# Patient Record
Sex: Male | Born: 1945 | Race: White | Hispanic: No | Marital: Married | Smoking: Never smoker
Health system: Southern US, Community
[De-identification: ages and names within clinical notes are randomized; demographics above are authoritative.]

## PROBLEM LIST (undated history)

## (undated) DIAGNOSIS — M545 Low back pain, unspecified: Secondary | ICD-10-CM

## (undated) DIAGNOSIS — E039 Hypothyroidism, unspecified: Secondary | ICD-10-CM

## (undated) DIAGNOSIS — N400 Enlarged prostate without lower urinary tract symptoms: Secondary | ICD-10-CM

## (undated) DIAGNOSIS — Z87438 Personal history of other diseases of male genital organs: Secondary | ICD-10-CM

## (undated) DIAGNOSIS — M5030 Other cervical disc degeneration, unspecified cervical region: Secondary | ICD-10-CM

## (undated) DIAGNOSIS — M659 Synovitis and tenosynovitis, unspecified: Secondary | ICD-10-CM

## (undated) DIAGNOSIS — E790 Hyperuricemia without signs of inflammatory arthritis and tophaceous disease: Secondary | ICD-10-CM

## (undated) DIAGNOSIS — H9190 Unspecified hearing loss, unspecified ear: Secondary | ICD-10-CM

## (undated) DIAGNOSIS — Z87898 Personal history of other specified conditions: Secondary | ICD-10-CM

## (undated) DIAGNOSIS — N183 Chronic kidney disease, stage 3 unspecified: Secondary | ICD-10-CM

## (undated) DIAGNOSIS — R768 Other specified abnormal immunological findings in serum: Secondary | ICD-10-CM

## (undated) DIAGNOSIS — Z87442 Personal history of urinary calculi: Secondary | ICD-10-CM

## (undated) DIAGNOSIS — E038 Other specified hypothyroidism: Secondary | ICD-10-CM

## (undated) DIAGNOSIS — R7303 Prediabetes: Secondary | ICD-10-CM

## (undated) DIAGNOSIS — M65939 Unspecified synovitis and tenosynovitis, unspecified forearm: Secondary | ICD-10-CM

## (undated) DIAGNOSIS — M109 Gout, unspecified: Secondary | ICD-10-CM

## (undated) DIAGNOSIS — N189 Chronic kidney disease, unspecified: Secondary | ICD-10-CM

## (undated) DIAGNOSIS — I10 Essential (primary) hypertension: Secondary | ICD-10-CM

## (undated) DIAGNOSIS — I129 Hypertensive chronic kidney disease with stage 1 through stage 4 chronic kidney disease, or unspecified chronic kidney disease: Secondary | ICD-10-CM

## (undated) DIAGNOSIS — E7800 Pure hypercholesterolemia, unspecified: Secondary | ICD-10-CM

## (undated) HISTORY — DX: Pure hypercholesterolemia, unspecified: E78.00

## (undated) HISTORY — DX: Personal history of other specified conditions: Z87.898

## (undated) HISTORY — DX: Other specified hypothyroidism: E03.8

## (undated) HISTORY — DX: Unspecified hearing loss, unspecified ear: H91.90

## (undated) HISTORY — DX: Gout, unspecified: M10.9

## (undated) HISTORY — DX: Hypothyroidism, unspecified: E03.9

## (undated) HISTORY — DX: Chronic kidney disease, stage 3 (moderate): N18.3

## (undated) HISTORY — DX: Unspecified synovitis and tenosynovitis, unspecified forearm: M65.939

## (undated) HISTORY — DX: Personal history of other diseases of male genital organs: Z87.438

## (undated) HISTORY — DX: Low back pain, unspecified: M54.50

## (undated) HISTORY — DX: Hyperuricemia without signs of inflammatory arthritis and tophaceous disease: E79.0

## (undated) HISTORY — PX: APPENDECTOMY: SHX54

## (undated) HISTORY — DX: Benign prostatic hyperplasia without lower urinary tract symptoms: N40.0

## (undated) HISTORY — PX: EYE SURGERY: SHX253

## (undated) HISTORY — DX: Other cervical disc degeneration, unspecified cervical region: M50.30

## (undated) HISTORY — PX: HEMORRHOIDECTOMY WITH HEMORRHOID BANDING: SHX5633

## (undated) HISTORY — PX: BACK SURGERY: SHX140

## (undated) HISTORY — DX: Essential (primary) hypertension: I10

## (undated) HISTORY — DX: Other specified abnormal immunological findings in serum: R76.8

## (undated) HISTORY — DX: Hypertensive chronic kidney disease with stage 1 through stage 4 chronic kidney disease, or unspecified chronic kidney disease: I12.9

## (undated) HISTORY — DX: Low back pain: M54.5

## (undated) HISTORY — DX: Chronic kidney disease, stage 3 unspecified: N18.30

## (undated) HISTORY — DX: Synovitis and tenosynovitis, unspecified: M65.9

## (undated) HISTORY — PX: OTHER SURGICAL HISTORY: SHX169

---

## 1964-06-13 DIAGNOSIS — N183 Chronic kidney disease, stage 3 unspecified: Secondary | ICD-10-CM

## 1964-06-13 HISTORY — DX: Chronic kidney disease, stage 3 unspecified: N18.30

## 2002-05-14 ENCOUNTER — Encounter: Payer: Self-pay | Admitting: *Deleted

## 2002-05-14 ENCOUNTER — Ambulatory Visit (HOSPITAL_COMMUNITY): Admission: RE | Admit: 2002-05-14 | Discharge: 2002-05-14 | Payer: Self-pay | Admitting: *Deleted

## 2011-09-13 DIAGNOSIS — I10 Essential (primary) hypertension: Secondary | ICD-10-CM | POA: Diagnosis not present

## 2011-09-13 DIAGNOSIS — N400 Enlarged prostate without lower urinary tract symptoms: Secondary | ICD-10-CM | POA: Diagnosis not present

## 2011-09-13 DIAGNOSIS — IMO0002 Reserved for concepts with insufficient information to code with codable children: Secondary | ICD-10-CM | POA: Diagnosis not present

## 2011-10-17 DIAGNOSIS — H5210 Myopia, unspecified eye: Secondary | ICD-10-CM | POA: Diagnosis not present

## 2011-10-17 DIAGNOSIS — H524 Presbyopia: Secondary | ICD-10-CM | POA: Diagnosis not present

## 2011-10-17 DIAGNOSIS — H2510 Age-related nuclear cataract, unspecified eye: Secondary | ICD-10-CM | POA: Diagnosis not present

## 2011-10-19 DIAGNOSIS — M3500 Sicca syndrome, unspecified: Secondary | ICD-10-CM | POA: Diagnosis not present

## 2011-10-19 DIAGNOSIS — M2550 Pain in unspecified joint: Secondary | ICD-10-CM | POA: Diagnosis not present

## 2011-10-19 DIAGNOSIS — Z5181 Encounter for therapeutic drug level monitoring: Secondary | ICD-10-CM | POA: Diagnosis not present

## 2011-10-19 DIAGNOSIS — M109 Gout, unspecified: Secondary | ICD-10-CM | POA: Diagnosis not present

## 2011-11-02 DIAGNOSIS — R748 Abnormal levels of other serum enzymes: Secondary | ICD-10-CM | POA: Diagnosis not present

## 2012-02-21 DIAGNOSIS — H2510 Age-related nuclear cataract, unspecified eye: Secondary | ICD-10-CM | POA: Diagnosis not present

## 2012-02-27 DIAGNOSIS — H2510 Age-related nuclear cataract, unspecified eye: Secondary | ICD-10-CM | POA: Diagnosis not present

## 2012-02-27 DIAGNOSIS — H269 Unspecified cataract: Secondary | ICD-10-CM | POA: Diagnosis not present

## 2012-02-28 DIAGNOSIS — H2510 Age-related nuclear cataract, unspecified eye: Secondary | ICD-10-CM | POA: Diagnosis not present

## 2012-03-05 DIAGNOSIS — H2510 Age-related nuclear cataract, unspecified eye: Secondary | ICD-10-CM | POA: Diagnosis not present

## 2012-03-19 DIAGNOSIS — H2510 Age-related nuclear cataract, unspecified eye: Secondary | ICD-10-CM | POA: Diagnosis not present

## 2012-03-19 DIAGNOSIS — H269 Unspecified cataract: Secondary | ICD-10-CM | POA: Diagnosis not present

## 2012-03-20 DIAGNOSIS — H2510 Age-related nuclear cataract, unspecified eye: Secondary | ICD-10-CM | POA: Diagnosis not present

## 2012-03-22 DIAGNOSIS — Z23 Encounter for immunization: Secondary | ICD-10-CM | POA: Diagnosis not present

## 2012-03-27 DIAGNOSIS — H2510 Age-related nuclear cataract, unspecified eye: Secondary | ICD-10-CM | POA: Diagnosis not present

## 2012-05-01 DIAGNOSIS — N400 Enlarged prostate without lower urinary tract symptoms: Secondary | ICD-10-CM | POA: Diagnosis not present

## 2012-05-01 DIAGNOSIS — I10 Essential (primary) hypertension: Secondary | ICD-10-CM | POA: Diagnosis not present

## 2012-05-04 DIAGNOSIS — M2550 Pain in unspecified joint: Secondary | ICD-10-CM | POA: Diagnosis not present

## 2012-05-04 DIAGNOSIS — M109 Gout, unspecified: Secondary | ICD-10-CM | POA: Diagnosis not present

## 2012-05-04 DIAGNOSIS — Z79899 Other long term (current) drug therapy: Secondary | ICD-10-CM | POA: Diagnosis not present

## 2012-05-04 DIAGNOSIS — M3500 Sicca syndrome, unspecified: Secondary | ICD-10-CM | POA: Diagnosis not present

## 2012-10-08 DIAGNOSIS — R946 Abnormal results of thyroid function studies: Secondary | ICD-10-CM | POA: Diagnosis not present

## 2012-10-08 DIAGNOSIS — R209 Unspecified disturbances of skin sensation: Secondary | ICD-10-CM | POA: Diagnosis not present

## 2012-10-26 DIAGNOSIS — Z79899 Other long term (current) drug therapy: Secondary | ICD-10-CM | POA: Diagnosis not present

## 2012-10-26 DIAGNOSIS — M3500 Sicca syndrome, unspecified: Secondary | ICD-10-CM | POA: Diagnosis not present

## 2012-10-26 DIAGNOSIS — G609 Hereditary and idiopathic neuropathy, unspecified: Secondary | ICD-10-CM | POA: Diagnosis not present

## 2012-10-26 DIAGNOSIS — M2550 Pain in unspecified joint: Secondary | ICD-10-CM | POA: Diagnosis not present

## 2012-10-26 DIAGNOSIS — M109 Gout, unspecified: Secondary | ICD-10-CM | POA: Diagnosis not present

## 2012-10-29 DIAGNOSIS — I10 Essential (primary) hypertension: Secondary | ICD-10-CM | POA: Diagnosis not present

## 2012-10-29 DIAGNOSIS — H9190 Unspecified hearing loss, unspecified ear: Secondary | ICD-10-CM | POA: Diagnosis not present

## 2012-10-29 DIAGNOSIS — M109 Gout, unspecified: Secondary | ICD-10-CM | POA: Diagnosis not present

## 2012-10-29 DIAGNOSIS — N400 Enlarged prostate without lower urinary tract symptoms: Secondary | ICD-10-CM | POA: Diagnosis not present

## 2012-11-21 DIAGNOSIS — H905 Unspecified sensorineural hearing loss: Secondary | ICD-10-CM | POA: Diagnosis not present

## 2013-03-06 DIAGNOSIS — Z23 Encounter for immunization: Secondary | ICD-10-CM | POA: Diagnosis not present

## 2013-04-23 DIAGNOSIS — Z125 Encounter for screening for malignant neoplasm of prostate: Secondary | ICD-10-CM | POA: Diagnosis not present

## 2013-04-23 DIAGNOSIS — E785 Hyperlipidemia, unspecified: Secondary | ICD-10-CM | POA: Diagnosis not present

## 2013-04-23 DIAGNOSIS — Z0000 Encounter for general adult medical examination without abnormal findings: Secondary | ICD-10-CM | POA: Diagnosis not present

## 2013-04-23 DIAGNOSIS — M109 Gout, unspecified: Secondary | ICD-10-CM | POA: Diagnosis not present

## 2013-04-23 DIAGNOSIS — I10 Essential (primary) hypertension: Secondary | ICD-10-CM | POA: Diagnosis not present

## 2013-04-23 DIAGNOSIS — L723 Sebaceous cyst: Secondary | ICD-10-CM | POA: Diagnosis not present

## 2013-04-23 DIAGNOSIS — N400 Enlarged prostate without lower urinary tract symptoms: Secondary | ICD-10-CM | POA: Diagnosis not present

## 2013-04-23 DIAGNOSIS — Z23 Encounter for immunization: Secondary | ICD-10-CM | POA: Diagnosis not present

## 2013-04-23 DIAGNOSIS — R946 Abnormal results of thyroid function studies: Secondary | ICD-10-CM | POA: Diagnosis not present

## 2013-04-23 DIAGNOSIS — T6391XA Toxic effect of contact with unspecified venomous animal, accidental (unintentional), initial encounter: Secondary | ICD-10-CM | POA: Diagnosis not present

## 2013-04-24 DIAGNOSIS — M109 Gout, unspecified: Secondary | ICD-10-CM | POA: Diagnosis not present

## 2013-04-24 DIAGNOSIS — R894 Abnormal immunological findings in specimens from other organs, systems and tissues: Secondary | ICD-10-CM | POA: Diagnosis not present

## 2013-04-24 DIAGNOSIS — Z79899 Other long term (current) drug therapy: Secondary | ICD-10-CM | POA: Diagnosis not present

## 2013-04-30 DIAGNOSIS — D72829 Elevated white blood cell count, unspecified: Secondary | ICD-10-CM | POA: Diagnosis not present

## 2013-05-14 DIAGNOSIS — D235 Other benign neoplasm of skin of trunk: Secondary | ICD-10-CM | POA: Diagnosis not present

## 2013-05-14 DIAGNOSIS — L259 Unspecified contact dermatitis, unspecified cause: Secondary | ICD-10-CM | POA: Diagnosis not present

## 2013-05-14 DIAGNOSIS — D1801 Hemangioma of skin and subcutaneous tissue: Secondary | ICD-10-CM | POA: Diagnosis not present

## 2013-05-14 DIAGNOSIS — I781 Nevus, non-neoplastic: Secondary | ICD-10-CM | POA: Diagnosis not present

## 2013-05-30 DIAGNOSIS — D72829 Elevated white blood cell count, unspecified: Secondary | ICD-10-CM | POA: Diagnosis not present

## 2013-08-22 DIAGNOSIS — R109 Unspecified abdominal pain: Secondary | ICD-10-CM | POA: Diagnosis not present

## 2013-08-22 DIAGNOSIS — N3000 Acute cystitis without hematuria: Secondary | ICD-10-CM | POA: Diagnosis not present

## 2013-10-22 DIAGNOSIS — I10 Essential (primary) hypertension: Secondary | ICD-10-CM | POA: Diagnosis not present

## 2013-10-22 DIAGNOSIS — E038 Other specified hypothyroidism: Secondary | ICD-10-CM | POA: Diagnosis not present

## 2013-10-22 DIAGNOSIS — M109 Gout, unspecified: Secondary | ICD-10-CM | POA: Diagnosis not present

## 2013-10-22 DIAGNOSIS — E785 Hyperlipidemia, unspecified: Secondary | ICD-10-CM | POA: Diagnosis not present

## 2013-10-22 DIAGNOSIS — E782 Mixed hyperlipidemia: Secondary | ICD-10-CM | POA: Diagnosis not present

## 2013-10-22 DIAGNOSIS — N183 Chronic kidney disease, stage 3 unspecified: Secondary | ICD-10-CM | POA: Diagnosis not present

## 2013-10-22 DIAGNOSIS — N400 Enlarged prostate without lower urinary tract symptoms: Secondary | ICD-10-CM | POA: Diagnosis not present

## 2013-10-23 DIAGNOSIS — R894 Abnormal immunological findings in specimens from other organs, systems and tissues: Secondary | ICD-10-CM | POA: Diagnosis not present

## 2013-10-23 DIAGNOSIS — M109 Gout, unspecified: Secondary | ICD-10-CM | POA: Diagnosis not present

## 2013-10-23 DIAGNOSIS — Z79899 Other long term (current) drug therapy: Secondary | ICD-10-CM | POA: Diagnosis not present

## 2014-03-04 DIAGNOSIS — Z23 Encounter for immunization: Secondary | ICD-10-CM | POA: Diagnosis not present

## 2014-03-23 DIAGNOSIS — N390 Urinary tract infection, site not specified: Secondary | ICD-10-CM | POA: Diagnosis not present

## 2014-03-25 DIAGNOSIS — N3000 Acute cystitis without hematuria: Secondary | ICD-10-CM | POA: Diagnosis not present

## 2014-04-01 DIAGNOSIS — N390 Urinary tract infection, site not specified: Secondary | ICD-10-CM | POA: Diagnosis not present

## 2014-04-21 DIAGNOSIS — Z79899 Other long term (current) drug therapy: Secondary | ICD-10-CM | POA: Diagnosis not present

## 2014-04-21 DIAGNOSIS — M1009 Idiopathic gout, multiple sites: Secondary | ICD-10-CM | POA: Diagnosis not present

## 2014-04-21 DIAGNOSIS — R768 Other specified abnormal immunological findings in serum: Secondary | ICD-10-CM | POA: Diagnosis not present

## 2014-05-06 DIAGNOSIS — N183 Chronic kidney disease, stage 3 (moderate): Secondary | ICD-10-CM | POA: Diagnosis not present

## 2014-05-06 DIAGNOSIS — Z125 Encounter for screening for malignant neoplasm of prostate: Secondary | ICD-10-CM | POA: Diagnosis not present

## 2014-05-06 DIAGNOSIS — Z1389 Encounter for screening for other disorder: Secondary | ICD-10-CM | POA: Diagnosis not present

## 2014-05-06 DIAGNOSIS — E782 Mixed hyperlipidemia: Secondary | ICD-10-CM | POA: Diagnosis not present

## 2014-05-06 DIAGNOSIS — I10 Essential (primary) hypertension: Secondary | ICD-10-CM | POA: Diagnosis not present

## 2014-05-06 DIAGNOSIS — Z0000 Encounter for general adult medical examination without abnormal findings: Secondary | ICD-10-CM | POA: Diagnosis not present

## 2014-05-06 DIAGNOSIS — Z23 Encounter for immunization: Secondary | ICD-10-CM | POA: Diagnosis not present

## 2014-05-06 DIAGNOSIS — N400 Enlarged prostate without lower urinary tract symptoms: Secondary | ICD-10-CM | POA: Diagnosis not present

## 2014-05-06 DIAGNOSIS — M109 Gout, unspecified: Secondary | ICD-10-CM | POA: Diagnosis not present

## 2014-05-06 DIAGNOSIS — S6982XA Other specified injuries of left wrist, hand and finger(s), initial encounter: Secondary | ICD-10-CM | POA: Diagnosis not present

## 2014-05-16 DIAGNOSIS — Z1211 Encounter for screening for malignant neoplasm of colon: Secondary | ICD-10-CM | POA: Diagnosis not present

## 2014-06-02 DIAGNOSIS — H5203 Hypermetropia, bilateral: Secondary | ICD-10-CM | POA: Diagnosis not present

## 2014-06-02 DIAGNOSIS — H52223 Regular astigmatism, bilateral: Secondary | ICD-10-CM | POA: Diagnosis not present

## 2014-06-02 DIAGNOSIS — H524 Presbyopia: Secondary | ICD-10-CM | POA: Diagnosis not present

## 2014-06-02 DIAGNOSIS — H04123 Dry eye syndrome of bilateral lacrimal glands: Secondary | ICD-10-CM | POA: Diagnosis not present

## 2014-06-02 DIAGNOSIS — H43813 Vitreous degeneration, bilateral: Secondary | ICD-10-CM | POA: Diagnosis not present

## 2014-06-02 DIAGNOSIS — H1859 Other hereditary corneal dystrophies: Secondary | ICD-10-CM | POA: Diagnosis not present

## 2014-10-20 DIAGNOSIS — M1009 Idiopathic gout, multiple sites: Secondary | ICD-10-CM | POA: Diagnosis not present

## 2014-10-20 DIAGNOSIS — R768 Other specified abnormal immunological findings in serum: Secondary | ICD-10-CM | POA: Diagnosis not present

## 2014-10-20 DIAGNOSIS — Z79899 Other long term (current) drug therapy: Secondary | ICD-10-CM | POA: Diagnosis not present

## 2014-11-04 DIAGNOSIS — E039 Hypothyroidism, unspecified: Secondary | ICD-10-CM | POA: Diagnosis not present

## 2014-11-04 DIAGNOSIS — N400 Enlarged prostate without lower urinary tract symptoms: Secondary | ICD-10-CM | POA: Diagnosis not present

## 2014-11-04 DIAGNOSIS — I10 Essential (primary) hypertension: Secondary | ICD-10-CM | POA: Diagnosis not present

## 2014-11-04 DIAGNOSIS — N183 Chronic kidney disease, stage 3 (moderate): Secondary | ICD-10-CM | POA: Diagnosis not present

## 2014-11-04 DIAGNOSIS — M109 Gout, unspecified: Secondary | ICD-10-CM | POA: Diagnosis not present

## 2014-11-04 DIAGNOSIS — E782 Mixed hyperlipidemia: Secondary | ICD-10-CM | POA: Diagnosis not present

## 2014-11-04 DIAGNOSIS — D72829 Elevated white blood cell count, unspecified: Secondary | ICD-10-CM | POA: Diagnosis not present

## 2014-12-11 DIAGNOSIS — R509 Fever, unspecified: Secondary | ICD-10-CM | POA: Diagnosis not present

## 2014-12-11 DIAGNOSIS — M549 Dorsalgia, unspecified: Secondary | ICD-10-CM | POA: Diagnosis not present

## 2014-12-11 DIAGNOSIS — R630 Anorexia: Secondary | ICD-10-CM | POA: Diagnosis not present

## 2014-12-11 DIAGNOSIS — R195 Other fecal abnormalities: Secondary | ICD-10-CM | POA: Diagnosis not present

## 2014-12-11 DIAGNOSIS — R350 Frequency of micturition: Secondary | ICD-10-CM | POA: Diagnosis not present

## 2014-12-23 DIAGNOSIS — R195 Other fecal abnormalities: Secondary | ICD-10-CM | POA: Diagnosis not present

## 2014-12-23 DIAGNOSIS — R5383 Other fatigue: Secondary | ICD-10-CM | POA: Diagnosis not present

## 2014-12-23 DIAGNOSIS — N10 Acute tubulo-interstitial nephritis: Secondary | ICD-10-CM | POA: Diagnosis not present

## 2015-01-06 DIAGNOSIS — R5383 Other fatigue: Secondary | ICD-10-CM | POA: Diagnosis not present

## 2015-03-24 DIAGNOSIS — Z23 Encounter for immunization: Secondary | ICD-10-CM | POA: Diagnosis not present

## 2015-04-22 DIAGNOSIS — R768 Other specified abnormal immunological findings in serum: Secondary | ICD-10-CM | POA: Diagnosis not present

## 2015-04-22 DIAGNOSIS — Z79899 Other long term (current) drug therapy: Secondary | ICD-10-CM | POA: Diagnosis not present

## 2015-04-22 DIAGNOSIS — M1009 Idiopathic gout, multiple sites: Secondary | ICD-10-CM | POA: Diagnosis not present

## 2015-05-28 DIAGNOSIS — M545 Low back pain: Secondary | ICD-10-CM | POA: Diagnosis not present

## 2015-05-28 DIAGNOSIS — N183 Chronic kidney disease, stage 3 (moderate): Secondary | ICD-10-CM | POA: Diagnosis not present

## 2015-05-28 DIAGNOSIS — E782 Mixed hyperlipidemia: Secondary | ICD-10-CM | POA: Diagnosis not present

## 2015-05-28 DIAGNOSIS — Z0000 Encounter for general adult medical examination without abnormal findings: Secondary | ICD-10-CM | POA: Diagnosis not present

## 2015-05-28 DIAGNOSIS — Z1389 Encounter for screening for other disorder: Secondary | ICD-10-CM | POA: Diagnosis not present

## 2015-05-28 DIAGNOSIS — N400 Enlarged prostate without lower urinary tract symptoms: Secondary | ICD-10-CM | POA: Diagnosis not present

## 2015-05-28 DIAGNOSIS — E039 Hypothyroidism, unspecified: Secondary | ICD-10-CM | POA: Diagnosis not present

## 2015-05-28 DIAGNOSIS — I10 Essential (primary) hypertension: Secondary | ICD-10-CM | POA: Diagnosis not present

## 2015-05-28 DIAGNOSIS — M109 Gout, unspecified: Secondary | ICD-10-CM | POA: Diagnosis not present

## 2015-05-28 DIAGNOSIS — Z1211 Encounter for screening for malignant neoplasm of colon: Secondary | ICD-10-CM | POA: Diagnosis not present

## 2015-05-28 DIAGNOSIS — Z125 Encounter for screening for malignant neoplasm of prostate: Secondary | ICD-10-CM | POA: Diagnosis not present

## 2015-08-07 DIAGNOSIS — D125 Benign neoplasm of sigmoid colon: Secondary | ICD-10-CM | POA: Diagnosis not present

## 2015-08-07 DIAGNOSIS — Z1211 Encounter for screening for malignant neoplasm of colon: Secondary | ICD-10-CM | POA: Diagnosis not present

## 2015-08-07 DIAGNOSIS — K621 Rectal polyp: Secondary | ICD-10-CM | POA: Diagnosis not present

## 2015-08-18 NOTE — Progress Notes (Signed)
While visiting   Cardiology Office Note   Date:  08/19/2015   ID:  Neil, Whitaker May 05, 1946, MRN [id number]  PCP:  Neil Kroner, MD  Cardiologist:  Neil Haw, MD    Chief Complaint  Patient presents with  . New Patient (Initial Visit)     History of Present Illness: Neil Whitaker is a 70 y.o. male who presents today for cardiology evaluation.   He presents for workup and follow-up of his blood pressure. He says that his blood pressure has been high for quite some time. It is now better controlled after being placed on lisinopril and increasing his amlodipine. He was also recently put on Coreg, and his blood pressures have been much better controlled on that ranging from the 120s up to the 150s. He says when his blood pressure is high recently, he has not had any symptoms. Prior to being treated, though, his blood pressure did get above 180 and he did feel headache type symptoms.   Today, he denies symptoms of palpitations, chest pain, shortness of breath, orthopnea, PND, lower extremity edema, claudication, dizziness, presyncope, syncope, bleeding, or neurologic sequela. The patient is tolerating medications without difficulties and is otherwise without complaint today.    Past Medical History  Diagnosis Date  . Degenerative cervical disc     WITH OCCASIONAL NERVE ROOT IRRITATION  . Hypertension   . BPH (benign prostatic hyperplasia)   . Hypercholesterolemia   . H/O prostatitis   . Benign hypertension with CKD (chronic kidney disease) stage III 1966  . History of elevated PSA   . Elevated blood uric acid level   . Gout of big toe   . Synovitis of wrist   . ANA positive   . Hearing loss     BILATERAL HEARING AID  . CKD (chronic kidney disease), stage III   . Low back pain   . Subclinical hypothyroidism   . CKD (chronic kidney disease), stage III    No past surgical history on file.   Current Outpatient Prescriptions  Medication Sig Dispense Refill  .  amLODipine (NORVASC) 10 MG tablet Take 10 mg by mouth daily.    Marland Kitchen aspirin EC 81 MG tablet Take 81 mg by mouth daily.    . carvedilol (COREG) 6.25 MG tablet Take 6.25 mg by mouth 2 (two) times daily with a meal.    . cycloSPORINE (RESTASIS) 0.05 % ophthalmic emulsion 1 drop 2 (two) times daily.    Marland Kitchen dutasteride (AVODART) 0.5 MG capsule Take 0.5 mg by mouth daily.    . Dutasteride-Tamsulosin HCl 0.5-0.4 MG CAPS Take 1 capsule by mouth daily.    . febuxostat (ULORIC) 40 MG tablet Take 40 mg by mouth daily.    Marland Kitchen lisinopril (PRINIVIL,ZESTRIL) 20 MG tablet Take 20 mg by mouth daily.    . Omega-3 Fatty Acids (FISH OIL) 1000 MG CAPS Take by mouth.     No current facility-administered medications for this visit.    Allergies:   Penicillins   Social History:  The patient  reports that he has never smoked. He does not have any smokeless tobacco history on file. He reports that he drinks alcohol. He reports that he does not use illicit drugs.   Family History:  The patient's family history includes CVA in his paternal grandmother; Cancer in his mother; Gout in his father; Heart attack in his paternal grandfather; Lupus in his sister.    ROS:  Please see the history of present illness.  Otherwise, review of systems is positive for none.   All other systems are reviewed and negative.    PHYSICAL EXAM: VS:  BP 122/74 mmHg  Pulse 68  Ht 6' (1.829 m)  Wt 185 lb 6.4 oz (84.097 kg)  BMI 25.14 kg/m2 , BMI Body mass index is 25.14 kg/(m^2). GEN: Well nourished, well developed, in no acute distress HEENT: normal Neck: no JVD, carotid bruits, or masses Cardiac: RRR; no murmurs, rubs, or gallops,no edema  Respiratory:  clear to auscultation bilaterally, normal work of breathing GI: soft, nontender, nondistended, + BS MS: no deformity or atrophy Skin: warm and dry Neuro:  Strength and sensation are intact Psych: euthymic mood, full affect  EKG:  EKG is ordered today. The ekg ordered today shows  sinus rhythm, rate 68  Recent Labs: No results found for requested labs within last 365 days.    Lipid Panel  No results found for: CHOL, TRIG, HDL, CHOLHDL, VLDL, LDLCALC, LDLDIRECT   Wt Readings from Last 3 Encounters:  08/19/15 185 lb 6.4 oz (84.097 kg)      ASSESSMENT AND PLAN:  1.  Hypertension: he brings in his blood pressures which are in the 150s at times but also down into the 130s. He is on lisinopril, amlodipine, and Coreg. His blood pressure has been better controlled after starting the Coreg. Due to that we Neil Whitaker increase his Coreg dose from 6.25-12-1/2 twice a day. I have told him to continue to check his blood pressures at home and to call us in a week or 2 with those results. At that time we may need to adjust his medications. I have warned him of symptoms of hypotension including weakness, fatigue, dizziness. I also told him that he should cut back on his medications if his blood pressure is low and he starts having symptoms.    Current medicines are reviewed at length with the patient today.   The patient does not have concerns regarding his medicines.  The following changes were made today:  Increase coreg to 12.5 mg BID  Labs/ tests ordered today include:  No orders of the defined types were placed in this encounter.     Disposition:   FU with Neil Whitaker pending BP results  Signed, Neil Correll Meredith Leeds, MD  08/19/2015 10:51 AM     [address] [place] [address] [phone] (office) [phone] (fax)

## 2015-08-19 ENCOUNTER — Ambulatory Visit (INDEPENDENT_AMBULATORY_CARE_PROVIDER_SITE_OTHER): Payer: Medicare Other | Admitting: Cardiology

## 2015-08-19 ENCOUNTER — Encounter: Payer: Self-pay | Admitting: Cardiology

## 2015-08-19 VITALS — BP 122/74 | HR 68 | Ht 72.0 in | Wt 185.4 lb

## 2015-08-19 DIAGNOSIS — I10 Essential (primary) hypertension: Secondary | ICD-10-CM | POA: Diagnosis not present

## 2015-08-19 NOTE — Progress Notes (Signed)
Patient admits to having issues with his personal BP cuff. Brought in cuff and watched patient use improperly (suspect that this may be the issue and not having BP control problems) He was placing cuff over wrong area and getting false high readings. Pt taught proper technique to obtaining his BP with his personal cuff. Per Dr. Curt Bears - will wait to change medication at this time.  Will have pt monitor BP correctly and call office on Friday to discuss findings.

## 2015-08-19 NOTE — Patient Instructions (Signed)
Medication Instructions:  Your physician recommends that you continue on your current medications as directed. Please refer to the Current Medication list given to you today.  Labwork: None ordered  Testing/Procedures: None ordered  Follow-Up: To be determined upon follow up blood pressure results.   Any Other Special Instructions Will Be Listed Below (If Applicable). Please call the office on Friday with your blood pressure readings.    If you need a refill on your cardiac medications before your next appointment, please call your pharmacy.  Thank you for choosing [facility]!!   Trinidad Curet, RN [phone]

## 2015-08-20 ENCOUNTER — Telehealth: Payer: Self-pay | Admitting: *Deleted

## 2015-08-20 MED ORDER — CARVEDILOL 12.5 MG PO TABS: 12.5000 mg | ORAL_TABLET | Freq: Two times a day (BID) | ORAL | Status: DC

## 2015-08-20 NOTE — Telephone Encounter (Signed)
Patient seen in office yesterday.  Before leaving he asked if we would check his personal BP cuff to see if working/reading correctly.  When patient demonstrated taking BP he placed cuff improperly giving wrong reading. Correct placement of cuff was reviewed with patient. We decided not to increase Carvedilol and check BPs over next few days to determine correct readings, before increasing medication. Called patient today who tells me that he has been taking BP properly all along and only did it wrong yesterday because he was in a hurry.  Reports taking increased dosage of Carvedilol (12.5 mg BID) last night and this morning. Last night reading showing 157/79, HR 81. Patient is going to continue increased dosage and keeping record of BPs. We agreed to speak next week and f/u on readings.

## 2015-09-03 ENCOUNTER — Telehealth: Payer: Self-pay | Admitting: Cardiology

## 2015-09-03 NOTE — Telephone Encounter (Signed)
Will review with Dr. Curt Bears when he is back in office next week. See 3/9 telephone note for documentation of f/u to this.

## 2015-09-03 NOTE — Telephone Encounter (Signed)
Walk in pt form-BP readings- dropped off gave to [employer]

## 2015-09-03 NOTE — Telephone Encounter (Signed)
Neil Whitaker dropped off BP recordings. Informed him that I will review with Dr. Curt Bears next week, when he returns to the office. Neil Whitaker understands I will call him once reviewed by physician.

## 2015-09-08 MED ORDER — CARVEDILOL 12.5 MG PO TABS: 12.5000 mg | ORAL_TABLET | Freq: Two times a day (BID) | ORAL | Status: AC

## 2015-09-08 NOTE — Telephone Encounter (Signed)
Dr. Curt Bears reviewed BP readings dropped of last week by patient (will scan readings into EPIC) Order to continue Carvedilol 12.5 mg BID. Will send year supply to [facility] per pt request. He will have PCP follow BP and understands he does not need to f/u w/ cardiology at this time. He will return to see general cardiologist if BPs become unmanageable again. He thanks [place] for helping in this matter.

## 2015-10-20 DIAGNOSIS — R768 Other specified abnormal immunological findings in serum: Secondary | ICD-10-CM | POA: Diagnosis not present

## 2015-10-20 DIAGNOSIS — M1009 Idiopathic gout, multiple sites: Secondary | ICD-10-CM | POA: Diagnosis not present

## 2015-10-20 DIAGNOSIS — Z79899 Other long term (current) drug therapy: Secondary | ICD-10-CM | POA: Diagnosis not present

## 2015-11-19 DIAGNOSIS — Z79899 Other long term (current) drug therapy: Secondary | ICD-10-CM | POA: Diagnosis not present

## 2015-11-26 DIAGNOSIS — M109 Gout, unspecified: Secondary | ICD-10-CM | POA: Diagnosis not present

## 2015-11-26 DIAGNOSIS — E782 Mixed hyperlipidemia: Secondary | ICD-10-CM | POA: Diagnosis not present

## 2015-11-26 DIAGNOSIS — N400 Enlarged prostate without lower urinary tract symptoms: Secondary | ICD-10-CM | POA: Diagnosis not present

## 2015-11-26 DIAGNOSIS — N183 Chronic kidney disease, stage 3 (moderate): Secondary | ICD-10-CM | POA: Diagnosis not present

## 2015-11-26 DIAGNOSIS — I10 Essential (primary) hypertension: Secondary | ICD-10-CM | POA: Diagnosis not present

## 2015-11-26 DIAGNOSIS — E039 Hypothyroidism, unspecified: Secondary | ICD-10-CM | POA: Diagnosis not present

## 2016-01-19 DIAGNOSIS — M545 Low back pain: Secondary | ICD-10-CM | POA: Diagnosis not present

## 2016-01-19 DIAGNOSIS — M7022 Olecranon bursitis, left elbow: Secondary | ICD-10-CM | POA: Diagnosis not present

## 2016-01-19 DIAGNOSIS — N183 Chronic kidney disease, stage 3 (moderate): Secondary | ICD-10-CM | POA: Diagnosis not present

## 2016-01-19 DIAGNOSIS — I10 Essential (primary) hypertension: Secondary | ICD-10-CM | POA: Diagnosis not present

## 2016-02-25 DIAGNOSIS — Z23 Encounter for immunization: Secondary | ICD-10-CM | POA: Diagnosis not present

## 2016-04-26 DIAGNOSIS — Z79899 Other long term (current) drug therapy: Secondary | ICD-10-CM | POA: Diagnosis not present

## 2016-04-26 DIAGNOSIS — R768 Other specified abnormal immunological findings in serum: Secondary | ICD-10-CM | POA: Diagnosis not present

## 2016-04-26 DIAGNOSIS — M1009 Idiopathic gout, multiple sites: Secondary | ICD-10-CM | POA: Diagnosis not present

## 2016-05-02 DIAGNOSIS — H353121 Nonexudative age-related macular degeneration, left eye, early dry stage: Secondary | ICD-10-CM | POA: Diagnosis not present

## 2016-05-02 DIAGNOSIS — H1859 Other hereditary corneal dystrophies: Secondary | ICD-10-CM | POA: Diagnosis not present

## 2016-05-02 DIAGNOSIS — H5203 Hypermetropia, bilateral: Secondary | ICD-10-CM | POA: Diagnosis not present

## 2016-05-02 DIAGNOSIS — H52223 Regular astigmatism, bilateral: Secondary | ICD-10-CM | POA: Diagnosis not present

## 2016-05-02 DIAGNOSIS — H04123 Dry eye syndrome of bilateral lacrimal glands: Secondary | ICD-10-CM | POA: Diagnosis not present

## 2016-05-02 DIAGNOSIS — H43813 Vitreous degeneration, bilateral: Secondary | ICD-10-CM | POA: Diagnosis not present

## 2016-05-02 DIAGNOSIS — H524 Presbyopia: Secondary | ICD-10-CM | POA: Diagnosis not present

## 2016-06-20 DIAGNOSIS — Z125 Encounter for screening for malignant neoplasm of prostate: Secondary | ICD-10-CM | POA: Diagnosis not present

## 2016-06-20 DIAGNOSIS — Z0000 Encounter for general adult medical examination without abnormal findings: Secondary | ICD-10-CM | POA: Diagnosis not present

## 2016-06-20 DIAGNOSIS — I10 Essential (primary) hypertension: Secondary | ICD-10-CM | POA: Diagnosis not present

## 2016-06-20 DIAGNOSIS — E782 Mixed hyperlipidemia: Secondary | ICD-10-CM | POA: Diagnosis not present

## 2016-06-20 DIAGNOSIS — N183 Chronic kidney disease, stage 3 (moderate): Secondary | ICD-10-CM | POA: Diagnosis not present

## 2016-06-20 DIAGNOSIS — Z1211 Encounter for screening for malignant neoplasm of colon: Secondary | ICD-10-CM | POA: Diagnosis not present

## 2016-06-20 DIAGNOSIS — Z1389 Encounter for screening for other disorder: Secondary | ICD-10-CM | POA: Diagnosis not present

## 2016-06-20 DIAGNOSIS — M109 Gout, unspecified: Secondary | ICD-10-CM | POA: Diagnosis not present

## 2016-06-20 DIAGNOSIS — N400 Enlarged prostate without lower urinary tract symptoms: Secondary | ICD-10-CM | POA: Diagnosis not present

## 2016-06-20 DIAGNOSIS — R748 Abnormal levels of other serum enzymes: Secondary | ICD-10-CM | POA: Diagnosis not present

## 2016-06-20 DIAGNOSIS — E039 Hypothyroidism, unspecified: Secondary | ICD-10-CM | POA: Diagnosis not present

## 2016-07-07 DIAGNOSIS — D72829 Elevated white blood cell count, unspecified: Secondary | ICD-10-CM | POA: Diagnosis not present

## 2016-07-07 DIAGNOSIS — R748 Abnormal levels of other serum enzymes: Secondary | ICD-10-CM | POA: Diagnosis not present

## 2016-07-08 ENCOUNTER — Other Ambulatory Visit: Payer: Self-pay | Admitting: Family Medicine

## 2016-07-08 DIAGNOSIS — R7989 Other specified abnormal findings of blood chemistry: Secondary | ICD-10-CM

## 2016-07-08 DIAGNOSIS — R945 Abnormal results of liver function studies: Principal | ICD-10-CM

## 2016-07-13 ENCOUNTER — Ambulatory Visit
Admission: RE | Admit: 2016-07-13 | Discharge: 2016-07-13 | Disposition: A | Discharge status: Home / Self Care | Payer: Medicare Other | Source: Ambulatory Visit | Attending: Family Medicine | Admitting: Family Medicine

## 2016-07-13 DIAGNOSIS — R7989 Other specified abnormal findings of blood chemistry: Secondary | ICD-10-CM

## 2016-07-13 DIAGNOSIS — R945 Abnormal results of liver function studies: Secondary | ICD-10-CM | POA: Diagnosis not present

## 2016-11-03 DIAGNOSIS — Z6823 Body mass index (BMI) 23.0-23.9, adult: Secondary | ICD-10-CM | POA: Diagnosis not present

## 2016-11-03 DIAGNOSIS — M1009 Idiopathic gout, multiple sites: Secondary | ICD-10-CM | POA: Diagnosis not present

## 2016-11-03 DIAGNOSIS — R768 Other specified abnormal immunological findings in serum: Secondary | ICD-10-CM | POA: Diagnosis not present

## 2016-11-03 DIAGNOSIS — Z79899 Other long term (current) drug therapy: Secondary | ICD-10-CM | POA: Diagnosis not present

## 2016-11-18 DIAGNOSIS — S80261A Insect bite (nonvenomous), right knee, initial encounter: Secondary | ICD-10-CM | POA: Diagnosis not present

## 2016-11-18 DIAGNOSIS — W57XXXA Bitten or stung by nonvenomous insect and other nonvenomous arthropods, initial encounter: Secondary | ICD-10-CM | POA: Diagnosis not present

## 2016-11-18 DIAGNOSIS — S40862A Insect bite (nonvenomous) of left upper arm, initial encounter: Secondary | ICD-10-CM | POA: Diagnosis not present

## 2017-01-02 DIAGNOSIS — K824 Cholesterolosis of gallbladder: Secondary | ICD-10-CM | POA: Diagnosis not present

## 2017-01-02 DIAGNOSIS — E039 Hypothyroidism, unspecified: Secondary | ICD-10-CM | POA: Diagnosis not present

## 2017-01-02 DIAGNOSIS — E782 Mixed hyperlipidemia: Secondary | ICD-10-CM | POA: Diagnosis not present

## 2017-01-02 DIAGNOSIS — I10 Essential (primary) hypertension: Secondary | ICD-10-CM | POA: Diagnosis not present

## 2017-01-02 DIAGNOSIS — M5412 Radiculopathy, cervical region: Secondary | ICD-10-CM | POA: Diagnosis not present

## 2017-01-02 DIAGNOSIS — L821 Other seborrheic keratosis: Secondary | ICD-10-CM | POA: Diagnosis not present

## 2017-01-02 DIAGNOSIS — M109 Gout, unspecified: Secondary | ICD-10-CM | POA: Diagnosis not present

## 2017-01-02 DIAGNOSIS — N183 Chronic kidney disease, stage 3 (moderate): Secondary | ICD-10-CM | POA: Diagnosis not present

## 2017-01-02 DIAGNOSIS — R945 Abnormal results of liver function studies: Secondary | ICD-10-CM | POA: Diagnosis not present

## 2017-01-02 DIAGNOSIS — N400 Enlarged prostate without lower urinary tract symptoms: Secondary | ICD-10-CM | POA: Diagnosis not present

## 2017-01-02 DIAGNOSIS — D72829 Elevated white blood cell count, unspecified: Secondary | ICD-10-CM | POA: Diagnosis not present

## 2017-02-02 DIAGNOSIS — H903 Sensorineural hearing loss, bilateral: Secondary | ICD-10-CM | POA: Diagnosis not present

## 2017-02-23 DIAGNOSIS — Z23 Encounter for immunization: Secondary | ICD-10-CM | POA: Diagnosis not present

## 2017-04-15 ENCOUNTER — Telehealth: Payer: Self-pay | Admitting: Oncology

## 2017-04-15 NOTE — Telephone Encounter (Signed)
Spoke with patient re new patient appointment with Dr. Alen Blew 11/29 @ 11 am. Date per patient as he will be out of town starting next week for two weeks.

## 2017-05-08 DIAGNOSIS — Z79899 Other long term (current) drug therapy: Secondary | ICD-10-CM | POA: Diagnosis not present

## 2017-05-08 DIAGNOSIS — R768 Other specified abnormal immunological findings in serum: Secondary | ICD-10-CM | POA: Diagnosis not present

## 2017-05-08 DIAGNOSIS — Z6824 Body mass index (BMI) 24.0-24.9, adult: Secondary | ICD-10-CM | POA: Diagnosis not present

## 2017-05-08 DIAGNOSIS — M1009 Idiopathic gout, multiple sites: Secondary | ICD-10-CM | POA: Diagnosis not present

## 2017-05-10 ENCOUNTER — Telehealth: Payer: Self-pay | Admitting: *Deleted

## 2017-05-10 NOTE — Telephone Encounter (Signed)
Called patient to remind him of his New Patient appointment tomorrow with Dr. Alen Blew at 1:30 pm. He verbalized understanding.

## 2017-05-11 ENCOUNTER — Telehealth: Payer: Self-pay | Admitting: Oncology

## 2017-05-11 ENCOUNTER — Ambulatory Visit (HOSPITAL_BASED_OUTPATIENT_CLINIC_OR_DEPARTMENT_OTHER): Payer: Medicare Other | Admitting: Oncology

## 2017-05-11 VITALS — BP 125/68 | HR 71 | Temp 97.7°F | Resp 18 | Ht 72.0 in | Wt 188.4 lb

## 2017-05-11 DIAGNOSIS — D72829 Elevated white blood cell count, unspecified: Secondary | ICD-10-CM | POA: Diagnosis not present

## 2017-05-11 NOTE — Telephone Encounter (Signed)
Scheduled appt per 11/29 los - Gave patient aVS and calender per los.  

## 2017-05-11 NOTE — Progress Notes (Signed)
Reason for Referral: Leukocytosis.  HPI: 71 year old gentleman currently of [place] where he lived the majority of his life.  Is a gentleman with history of hypertension and chronic renal insufficiency but for the most part in reasonable health and shape.  He was noted to have leukocytosis on routine physical examination dating back to at least 2016.  He does have history of gout and a positive ANA noted in the past.  He is a CBC in May 2016 showed a white cell count of 10.3 with a lymphocytosis at 54.9%.  His CBC was repeated on my 30th 2016 showed a white cell count of 15.1 with normal lymphocyte percentage.  In January 2018 his white cell count was 14.0.  In January 02, 2017 his white cell count was 13.4, hemoglobin 14.0 and a platelet count of 245.  His neutrophil percentage was 38% with lymphocytes slightly elevated at 50.4.  Otherwise his differential was normal.  He is completely asymptomatic from these findings.  He denies any constitutional symptoms of weight loss or lymphadenopathy.  He remains active and attends to activities of daily living.  He denies any smoking history although his wife smokes.  He does not report any headaches, blurry vision, syncope or seizures.  He does not report any fevers or chills or sweats.  He does not report any cough, wheezing or hemoptysis.  He does not report any nausea, vomiting or abdominal pain.  He does not report any frequency urgency or hesitancy.  He does not report a skeletal complaints.  Remaining review of systems unremarkable.  Past Medical History:  Diagnosis Date  . ANA positive   . Benign hypertension with CKD (chronic kidney disease) stage III 1966  . BPH (benign prostatic hyperplasia)   . CKD (chronic kidney disease), stage III   . CKD (chronic kidney disease), stage III   . 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 Elevated blood uric acid level   . Gout of big toe   . H/O prostatitis   . Hearing loss    BILATERAL HEARING AID  . History of elevated PSA   . Hypercholesterolemia   . Hypertension   . Low back pain   . Subclinical hypothyroidism   . Synovitis of wrist   :  No past surgical history on file.:   Current Outpatient Medications:  .  amLODipine (NORVASC) 10 MG tablet, Take 10 mg by mouth daily., Disp: , Rfl:  .  aspirin EC 81 MG tablet, Take 81 mg by mouth daily., Disp: , Rfl:  .  carvedilol (COREG) 12.5 MG tablet, Take 1 tablet (12.5 mg total) by mouth 2 (two) times daily., Disp: 180 tablet, Rfl: 3 .  cycloSPORINE (RESTASIS) 0.05 % ophthalmic emulsion, 1 drop 2 (two) times daily., Disp: , Rfl:  .  dutasteride (AVODART) 0.5 MG capsule, Take 0.5 mg by mouth daily., Disp: , Rfl:  .  Dutasteride-Tamsulosin HCl 0.5-0.4 MG CAPS, Take 1 capsule by mouth daily., Disp: , Rfl:  .  febuxostat (ULORIC) 40 MG tablet, Take 40 mg by mouth daily., Disp: , Rfl:  .  lisinopril (PRINIVIL,ZESTRIL) 20 MG tablet, Take 20 mg by mouth daily., Disp: , Rfl:  .  Omega-3 Fatty Acids (FISH OIL) 1000 MG CAPS, Take by mouth., Disp: , Rfl: :  Allergies  Allergen Reactions  . Penicillins   :  Family History  Problem Relation Age of Onset  . Cancer Mother        KIDNEY  .  Gout Father   . Lupus Sister   . CVA Paternal Grandmother   . Heart attack Paternal Grandfather   :  Social History   Socioeconomic History  . Marital status: Married    Spouse name: Not on file  . Number of children: Not on file  . Years of education: Not on file  . Highest education level: Not on file  Social Needs  . Financial resource strain: Not on file  . Food insecurity - worry: Not on file  . Food insecurity - inability: Not on file  . Transportation needs - medical: Not on file  . Transportation needs - non-medical: Not on file  Occupational History  . Not on file  Tobacco Use  . Smoking status: Never Smoker  Substance and Sexual Activity  . Alcohol use: Yes    Comment: RARE  . Drug use: No  . Sexual  activity: Yes    Comment: SPOUSE NAME MELODY  Other Topics Concern  . Not on file  Social History Narrative  . Not on file  :  Pertinent items are noted in HPI.  Exam: Blood pressure 125/68, pulse 71, temperature 97.7 F (36.5 C), temperature source Oral, resp. rate 18, height 6' (1.829 m), weight 188 lb 6.4 oz (85.5 kg), SpO2 96 %.  ECOG 0 General appearance: alert and cooperative appeared without distress. Throat: No oral ulcers or lesions. Neck: no adenopathy or masses. Resp: clear to auscultation bilaterally without rhonchi or wheezes. Chest wall: no tenderness or masses. Cardio: regular rate and rhythm, S1, S2 normal, no murmur, click, rub or gallop Extremities: extremities normal, atraumatic, no cyanosis or edema Pulses: 2+ and symmetric Skin: Skin color, texture, turgor normal. No rashes or lesions Lymph nodes: Cervical, supraclavicular, and axillary nodes normal.     Assessment and Plan:   71 year old gentleman with the following issues:  1.  Leukocytosis with occasional lymphocytosis dating back to 2016.  His CBC in July 2018 showed a white cell count of 13.4 with lymphocytosis.  He has no more red cell count and platelets.  These findings have been fluctuating since 2016 with white cell count ranges from 10,000-14,000.  Differential diagnosis was discussed today with the patient.  This could represent reactive leukocytosis of a benign etiology.  Lymphoproliferative disorder cannot be completely ruled out at this time conditions such as CLL, mantle cell lymphoma among others.  From a management standpoint, I have recommended repeating his CBC in few months and obtaining peripheral blood flow cytometry to rule out lymphoproliferative disorder.  If this test does not show any evidence of lymphoproliferative disorder, no further investigation will be needed at that time.  Occult malignancy considered less likely given the chronicity of the findings as well as lack of  symptoms.  2.  Follow-up: Will be in 4 months to repeat laboratory testing.

## 2017-06-28 DIAGNOSIS — H43813 Vitreous degeneration, bilateral: Secondary | ICD-10-CM | POA: Diagnosis not present

## 2017-06-28 DIAGNOSIS — H52222 Regular astigmatism, left eye: Secondary | ICD-10-CM | POA: Diagnosis not present

## 2017-06-28 DIAGNOSIS — H353121 Nonexudative age-related macular degeneration, left eye, early dry stage: Secondary | ICD-10-CM | POA: Diagnosis not present

## 2017-06-28 DIAGNOSIS — H5203 Hypermetropia, bilateral: Secondary | ICD-10-CM | POA: Diagnosis not present

## 2017-06-28 DIAGNOSIS — H524 Presbyopia: Secondary | ICD-10-CM | POA: Diagnosis not present

## 2017-06-28 DIAGNOSIS — Z961 Presence of intraocular lens: Secondary | ICD-10-CM | POA: Diagnosis not present

## 2017-07-25 ENCOUNTER — Other Ambulatory Visit: Payer: Self-pay | Admitting: Family Medicine

## 2017-07-25 DIAGNOSIS — E039 Hypothyroidism, unspecified: Secondary | ICD-10-CM | POA: Diagnosis not present

## 2017-07-25 DIAGNOSIS — D72829 Elevated white blood cell count, unspecified: Secondary | ICD-10-CM | POA: Diagnosis not present

## 2017-07-25 DIAGNOSIS — M109 Gout, unspecified: Secondary | ICD-10-CM | POA: Diagnosis not present

## 2017-07-25 DIAGNOSIS — L989 Disorder of the skin and subcutaneous tissue, unspecified: Secondary | ICD-10-CM | POA: Diagnosis not present

## 2017-07-25 DIAGNOSIS — K824 Cholesterolosis of gallbladder: Secondary | ICD-10-CM

## 2017-07-25 DIAGNOSIS — I10 Essential (primary) hypertension: Secondary | ICD-10-CM | POA: Diagnosis not present

## 2017-07-25 DIAGNOSIS — Z125 Encounter for screening for malignant neoplasm of prostate: Secondary | ICD-10-CM | POA: Diagnosis not present

## 2017-07-25 DIAGNOSIS — R945 Abnormal results of liver function studies: Secondary | ICD-10-CM | POA: Diagnosis not present

## 2017-07-25 DIAGNOSIS — N183 Chronic kidney disease, stage 3 (moderate): Secondary | ICD-10-CM | POA: Diagnosis not present

## 2017-07-25 DIAGNOSIS — Z1389 Encounter for screening for other disorder: Secondary | ICD-10-CM | POA: Diagnosis not present

## 2017-07-25 DIAGNOSIS — Z0000 Encounter for general adult medical examination without abnormal findings: Secondary | ICD-10-CM | POA: Diagnosis not present

## 2017-07-25 DIAGNOSIS — E782 Mixed hyperlipidemia: Secondary | ICD-10-CM | POA: Diagnosis not present

## 2017-08-04 ENCOUNTER — Ambulatory Visit
Admission: RE | Admit: 2017-08-04 | Discharge: 2017-08-04 | Disposition: A | Discharge status: Home / Self Care | Payer: Medicare Other | Source: Ambulatory Visit | Attending: Family Medicine | Admitting: Family Medicine

## 2017-08-04 DIAGNOSIS — K824 Cholesterolosis of gallbladder: Secondary | ICD-10-CM

## 2017-08-04 DIAGNOSIS — K7689 Other specified diseases of liver: Secondary | ICD-10-CM | POA: Diagnosis not present

## 2017-08-07 ENCOUNTER — Telehealth: Payer: Self-pay | Admitting: *Deleted

## 2017-08-07 NOTE — Telephone Encounter (Signed)
Left message with patient that we cancelled his 09/08/17 appointment since, he will be on vacation. This office will call him with CBC results. Patient to be on vacation from 3/29 - 4/7. Instructed patient to call [place] if he had any questions or concerns.

## 2017-08-08 DIAGNOSIS — I872 Venous insufficiency (chronic) (peripheral): Secondary | ICD-10-CM | POA: Diagnosis not present

## 2017-08-08 DIAGNOSIS — B078 Other viral warts: Secondary | ICD-10-CM | POA: Diagnosis not present

## 2017-08-08 DIAGNOSIS — I781 Nevus, non-neoplastic: Secondary | ICD-10-CM | POA: Diagnosis not present

## 2017-08-31 ENCOUNTER — Telehealth: Payer: Self-pay | Admitting: Oncology

## 2017-08-31 NOTE — Telephone Encounter (Signed)
Patient called to reschedule he has a funeral to go to

## 2017-09-01 ENCOUNTER — Inpatient Hospital Stay: Payer: Medicare Other | Attending: Oncology

## 2017-09-01 ENCOUNTER — Other Ambulatory Visit: Payer: Medicare Other

## 2017-09-01 DIAGNOSIS — D72829 Elevated white blood cell count, unspecified: Secondary | ICD-10-CM | POA: Insufficient documentation

## 2017-09-01 LAB — CBC WITH DIFFERENTIAL/PLATELET
BASOS ABS: 0 10*3/uL (ref 0.0–0.1)
Basophils Relative: 1 %
Eosinophils Absolute: 0.3 10*3/uL (ref 0.0–0.5)
Eosinophils Relative: 4 %
HEMATOCRIT: 38.9 % (ref 38.4–49.9)
HEMOGLOBIN: 13.1 g/dL (ref 13.0–17.1)
LYMPHS PCT: 52 %
Lymphs Abs: 5.1 10*3/uL — ABNORMAL HIGH (ref 0.9–3.3)
MCH: 29.9 pg (ref 27.2–33.4)
MCHC: 33.6 g/dL (ref 32.0–36.0)
MCV: 88.9 fL (ref 79.3–98.0)
Monocytes Absolute: 1 10*3/uL — ABNORMAL HIGH (ref 0.1–0.9)
Monocytes Relative: 10 %
NEUTROS ABS: 3.2 10*3/uL (ref 1.5–6.5)
NEUTROS PCT: 33 %
Platelets: 202 10*3/uL (ref 140–400)
RBC: 4.37 MIL/uL (ref 4.20–5.82)
RDW: 13.5 % (ref 11.0–14.6)
WBC: 9.7 10*3/uL (ref 4.0–10.3)

## 2017-09-05 LAB — FLOW CYTOMETRY

## 2017-09-08 ENCOUNTER — Ambulatory Visit: Payer: Medicare Other | Admitting: Oncology

## 2017-10-23 DIAGNOSIS — M1009 Idiopathic gout, multiple sites: Secondary | ICD-10-CM | POA: Diagnosis not present

## 2017-10-23 DIAGNOSIS — R768 Other specified abnormal immunological findings in serum: Secondary | ICD-10-CM | POA: Diagnosis not present

## 2017-10-23 DIAGNOSIS — Z79899 Other long term (current) drug therapy: Secondary | ICD-10-CM | POA: Diagnosis not present

## 2017-10-23 DIAGNOSIS — Z6824 Body mass index (BMI) 24.0-24.9, adult: Secondary | ICD-10-CM | POA: Diagnosis not present

## 2017-11-21 DIAGNOSIS — Z79899 Other long term (current) drug therapy: Secondary | ICD-10-CM | POA: Diagnosis not present

## 2017-11-21 DIAGNOSIS — M1009 Idiopathic gout, multiple sites: Secondary | ICD-10-CM | POA: Diagnosis not present

## 2017-12-12 DIAGNOSIS — R300 Dysuria: Secondary | ICD-10-CM | POA: Diagnosis not present

## 2017-12-12 DIAGNOSIS — N390 Urinary tract infection, site not specified: Secondary | ICD-10-CM | POA: Diagnosis not present

## 2018-01-04 DIAGNOSIS — N3000 Acute cystitis without hematuria: Secondary | ICD-10-CM | POA: Diagnosis not present

## 2018-01-04 DIAGNOSIS — R350 Frequency of micturition: Secondary | ICD-10-CM | POA: Diagnosis not present

## 2018-01-22 DIAGNOSIS — N400 Enlarged prostate without lower urinary tract symptoms: Secondary | ICD-10-CM | POA: Diagnosis not present

## 2018-01-22 DIAGNOSIS — E039 Hypothyroidism, unspecified: Secondary | ICD-10-CM | POA: Diagnosis not present

## 2018-01-22 DIAGNOSIS — I10 Essential (primary) hypertension: Secondary | ICD-10-CM | POA: Diagnosis not present

## 2018-01-22 DIAGNOSIS — E782 Mixed hyperlipidemia: Secondary | ICD-10-CM | POA: Diagnosis not present

## 2018-01-22 DIAGNOSIS — M109 Gout, unspecified: Secondary | ICD-10-CM | POA: Diagnosis not present

## 2018-01-22 DIAGNOSIS — M5412 Radiculopathy, cervical region: Secondary | ICD-10-CM | POA: Diagnosis not present

## 2018-01-22 DIAGNOSIS — R945 Abnormal results of liver function studies: Secondary | ICD-10-CM | POA: Diagnosis not present

## 2018-01-22 DIAGNOSIS — K824 Cholesterolosis of gallbladder: Secondary | ICD-10-CM | POA: Diagnosis not present

## 2018-01-22 DIAGNOSIS — N183 Chronic kidney disease, stage 3 (moderate): Secondary | ICD-10-CM | POA: Diagnosis not present

## 2018-01-22 DIAGNOSIS — E875 Hyperkalemia: Secondary | ICD-10-CM | POA: Diagnosis not present

## 2018-01-22 DIAGNOSIS — D72829 Elevated white blood cell count, unspecified: Secondary | ICD-10-CM | POA: Diagnosis not present

## 2018-01-25 DIAGNOSIS — E875 Hyperkalemia: Secondary | ICD-10-CM | POA: Diagnosis not present

## 2018-03-07 DIAGNOSIS — Z23 Encounter for immunization: Secondary | ICD-10-CM | POA: Diagnosis not present

## 2018-04-30 DIAGNOSIS — Z79899 Other long term (current) drug therapy: Secondary | ICD-10-CM | POA: Diagnosis not present

## 2018-04-30 DIAGNOSIS — M1009 Idiopathic gout, multiple sites: Secondary | ICD-10-CM | POA: Diagnosis not present

## 2018-04-30 DIAGNOSIS — R768 Other specified abnormal immunological findings in serum: Secondary | ICD-10-CM | POA: Diagnosis not present

## 2018-04-30 DIAGNOSIS — Z6823 Body mass index (BMI) 23.0-23.9, adult: Secondary | ICD-10-CM | POA: Diagnosis not present

## 2018-08-01 ENCOUNTER — Other Ambulatory Visit: Payer: Self-pay | Admitting: Family Medicine

## 2018-08-01 DIAGNOSIS — K824 Cholesterolosis of gallbladder: Secondary | ICD-10-CM

## 2018-08-03 DIAGNOSIS — H16223 Keratoconjunctivitis sicca, not specified as Sjogren's, bilateral: Secondary | ICD-10-CM | POA: Diagnosis not present

## 2018-08-03 DIAGNOSIS — H5203 Hypermetropia, bilateral: Secondary | ICD-10-CM | POA: Diagnosis not present

## 2018-08-03 DIAGNOSIS — H52222 Regular astigmatism, left eye: Secondary | ICD-10-CM | POA: Diagnosis not present

## 2018-08-03 DIAGNOSIS — H524 Presbyopia: Secondary | ICD-10-CM | POA: Diagnosis not present

## 2018-08-09 ENCOUNTER — Ambulatory Visit
Admission: RE | Admit: 2018-08-09 | Discharge: 2018-08-09 | Disposition: A | Discharge status: Home / Self Care | Payer: Medicare Other | Source: Ambulatory Visit | Attending: Family Medicine | Admitting: Family Medicine

## 2018-08-09 DIAGNOSIS — K8020 Calculus of gallbladder without cholecystitis without obstruction: Secondary | ICD-10-CM | POA: Diagnosis not present

## 2018-08-09 DIAGNOSIS — K824 Cholesterolosis of gallbladder: Secondary | ICD-10-CM

## 2018-08-09 DIAGNOSIS — K828 Other specified diseases of gallbladder: Secondary | ICD-10-CM | POA: Diagnosis not present

## 2018-09-04 DIAGNOSIS — E039 Hypothyroidism, unspecified: Secondary | ICD-10-CM | POA: Diagnosis not present

## 2018-09-04 DIAGNOSIS — M5412 Radiculopathy, cervical region: Secondary | ICD-10-CM | POA: Diagnosis not present

## 2018-09-04 DIAGNOSIS — D72829 Elevated white blood cell count, unspecified: Secondary | ICD-10-CM | POA: Diagnosis not present

## 2018-09-04 DIAGNOSIS — Z1389 Encounter for screening for other disorder: Secondary | ICD-10-CM | POA: Diagnosis not present

## 2018-09-04 DIAGNOSIS — Z125 Encounter for screening for malignant neoplasm of prostate: Secondary | ICD-10-CM | POA: Diagnosis not present

## 2018-09-04 DIAGNOSIS — I10 Essential (primary) hypertension: Secondary | ICD-10-CM | POA: Diagnosis not present

## 2018-09-04 DIAGNOSIS — K760 Fatty (change of) liver, not elsewhere classified: Secondary | ICD-10-CM | POA: Diagnosis not present

## 2018-09-04 DIAGNOSIS — N400 Enlarged prostate without lower urinary tract symptoms: Secondary | ICD-10-CM | POA: Diagnosis not present

## 2018-09-04 DIAGNOSIS — N183 Chronic kidney disease, stage 3 (moderate): Secondary | ICD-10-CM | POA: Diagnosis not present

## 2018-09-04 DIAGNOSIS — Z0000 Encounter for general adult medical examination without abnormal findings: Secondary | ICD-10-CM | POA: Diagnosis not present

## 2018-09-04 DIAGNOSIS — M109 Gout, unspecified: Secondary | ICD-10-CM | POA: Diagnosis not present

## 2018-09-04 DIAGNOSIS — E782 Mixed hyperlipidemia: Secondary | ICD-10-CM | POA: Diagnosis not present

## 2018-10-29 DIAGNOSIS — G629 Polyneuropathy, unspecified: Secondary | ICD-10-CM | POA: Diagnosis not present

## 2018-10-29 DIAGNOSIS — M1009 Idiopathic gout, multiple sites: Secondary | ICD-10-CM | POA: Diagnosis not present

## 2018-10-29 DIAGNOSIS — Z79899 Other long term (current) drug therapy: Secondary | ICD-10-CM | POA: Diagnosis not present

## 2018-10-29 DIAGNOSIS — R768 Other specified abnormal immunological findings in serum: Secondary | ICD-10-CM | POA: Diagnosis not present

## 2018-10-30 DIAGNOSIS — M1009 Idiopathic gout, multiple sites: Secondary | ICD-10-CM | POA: Diagnosis not present

## 2018-12-28 DIAGNOSIS — M1009 Idiopathic gout, multiple sites: Secondary | ICD-10-CM | POA: Diagnosis not present

## 2019-01-28 DIAGNOSIS — Z79899 Other long term (current) drug therapy: Secondary | ICD-10-CM | POA: Diagnosis not present

## 2019-01-28 DIAGNOSIS — M1009 Idiopathic gout, multiple sites: Secondary | ICD-10-CM | POA: Diagnosis not present

## 2019-02-05 DIAGNOSIS — Z23 Encounter for immunization: Secondary | ICD-10-CM | POA: Diagnosis not present

## 2019-03-12 DIAGNOSIS — N183 Chronic kidney disease, stage 3 (moderate): Secondary | ICD-10-CM | POA: Diagnosis not present

## 2019-03-12 DIAGNOSIS — E782 Mixed hyperlipidemia: Secondary | ICD-10-CM | POA: Diagnosis not present

## 2019-03-12 DIAGNOSIS — K760 Fatty (change of) liver, not elsewhere classified: Secondary | ICD-10-CM | POA: Diagnosis not present

## 2019-03-12 DIAGNOSIS — E039 Hypothyroidism, unspecified: Secondary | ICD-10-CM | POA: Diagnosis not present

## 2019-03-12 DIAGNOSIS — N400 Enlarged prostate without lower urinary tract symptoms: Secondary | ICD-10-CM | POA: Diagnosis not present

## 2019-03-12 DIAGNOSIS — M109 Gout, unspecified: Secondary | ICD-10-CM | POA: Diagnosis not present

## 2019-03-12 DIAGNOSIS — M5412 Radiculopathy, cervical region: Secondary | ICD-10-CM | POA: Diagnosis not present

## 2019-03-12 DIAGNOSIS — D72829 Elevated white blood cell count, unspecified: Secondary | ICD-10-CM | POA: Diagnosis not present

## 2019-03-12 DIAGNOSIS — I10 Essential (primary) hypertension: Secondary | ICD-10-CM | POA: Diagnosis not present

## 2019-03-28 DIAGNOSIS — E039 Hypothyroidism, unspecified: Secondary | ICD-10-CM | POA: Diagnosis not present

## 2019-03-28 DIAGNOSIS — I10 Essential (primary) hypertension: Secondary | ICD-10-CM | POA: Diagnosis not present

## 2019-03-28 DIAGNOSIS — D72829 Elevated white blood cell count, unspecified: Secondary | ICD-10-CM | POA: Diagnosis not present

## 2019-04-10 DIAGNOSIS — L304 Erythema intertrigo: Secondary | ICD-10-CM | POA: Diagnosis not present

## 2019-04-23 DIAGNOSIS — M1009 Idiopathic gout, multiple sites: Secondary | ICD-10-CM | POA: Diagnosis not present

## 2019-04-23 DIAGNOSIS — R768 Other specified abnormal immunological findings in serum: Secondary | ICD-10-CM | POA: Diagnosis not present

## 2019-04-23 DIAGNOSIS — Z79899 Other long term (current) drug therapy: Secondary | ICD-10-CM | POA: Diagnosis not present

## 2019-04-23 DIAGNOSIS — G629 Polyneuropathy, unspecified: Secondary | ICD-10-CM | POA: Diagnosis not present

## 2019-04-23 DIAGNOSIS — Z6823 Body mass index (BMI) 23.0-23.9, adult: Secondary | ICD-10-CM | POA: Diagnosis not present

## 2019-07-02 ENCOUNTER — Ambulatory Visit: Payer: Medicare Other | Attending: Internal Medicine

## 2019-07-02 DIAGNOSIS — Z23 Encounter for immunization: Secondary | ICD-10-CM | POA: Diagnosis not present

## 2019-07-02 NOTE — Progress Notes (Signed)
   Covid-19 Vaccination Clinic  Name:  [name]    MRN: [id number] DOB: 02-01-1946  07/02/2019  Mr. Neil Whitaker was observed post Covid-19 immunization for 15 minutes without incidence. He was provided with Vaccine Information Sheet and instruction to access the V-Safe system.   Mr. Neil Whitaker was instructed to call 911 with any severe reactions post vaccine: Marland Kitchen Difficulty breathing  . Swelling of your face and throat  . A fast heartbeat  . A bad rash all over your body  . Dizziness and weakness    Immunizations Administered    Name Date Dose VIS Date Route   Pfizer COVID-19 Vaccine 07/02/2019 11:48 AM 0.3 mL 05/24/2019 Intramuscular   Manufacturer: [place]   Lot: [id number]   [place]: [id number]

## 2019-07-08 DIAGNOSIS — B0230 Zoster ocular disease, unspecified: Secondary | ICD-10-CM | POA: Diagnosis not present

## 2019-07-08 DIAGNOSIS — B0239 Other herpes zoster eye disease: Secondary | ICD-10-CM | POA: Diagnosis not present

## 2019-07-16 DIAGNOSIS — B0239 Other herpes zoster eye disease: Secondary | ICD-10-CM | POA: Diagnosis not present

## 2019-07-22 ENCOUNTER — Ambulatory Visit: Payer: Medicare Other | Attending: Internal Medicine

## 2019-07-22 DIAGNOSIS — Z23 Encounter for immunization: Secondary | ICD-10-CM | POA: Insufficient documentation

## 2019-07-22 NOTE — Progress Notes (Signed)
   Covid-19 Vaccination Clinic  Name:  Neil Whitaker    MRN: [id number] DOB: [birth year]  07/22/2019  Mr. Neil Whitaker was observed post Covid-19 immunization for 15 minutes without incidence. He was provided with Vaccine Information Sheet and instruction to access the V-Safe system.   Mr. Neil Whitaker was instructed to call 911 with any severe reactions post vaccine: Marland Kitchen Difficulty breathing  . Swelling of your face and throat  . A fast heartbeat  . A bad rash all over your body  . Dizziness and weakness    Immunizations Administered    Name Date Dose VIS Date Route   Pfizer COVID-19 Vaccine 07/22/2019  9:34 AM 0.3 mL 05/24/2019 Intramuscular   Manufacturer: [place]   Lot: [id number]   [place]: [id number]

## 2019-08-15 DIAGNOSIS — B0239 Other herpes zoster eye disease: Secondary | ICD-10-CM | POA: Diagnosis not present

## 2019-08-15 DIAGNOSIS — H04123 Dry eye syndrome of bilateral lacrimal glands: Secondary | ICD-10-CM | POA: Diagnosis not present

## 2019-08-15 DIAGNOSIS — Z961 Presence of intraocular lens: Secondary | ICD-10-CM | POA: Diagnosis not present

## 2019-09-18 DIAGNOSIS — H16223 Keratoconjunctivitis sicca, not specified as Sjogren's, bilateral: Secondary | ICD-10-CM | POA: Diagnosis not present

## 2019-09-18 DIAGNOSIS — H5203 Hypermetropia, bilateral: Secondary | ICD-10-CM | POA: Diagnosis not present

## 2019-09-18 DIAGNOSIS — Z961 Presence of intraocular lens: Secondary | ICD-10-CM | POA: Diagnosis not present

## 2019-09-18 DIAGNOSIS — H524 Presbyopia: Secondary | ICD-10-CM | POA: Diagnosis not present

## 2019-09-24 DIAGNOSIS — Z0000 Encounter for general adult medical examination without abnormal findings: Secondary | ICD-10-CM | POA: Diagnosis not present

## 2019-09-24 DIAGNOSIS — I10 Essential (primary) hypertension: Secondary | ICD-10-CM | POA: Diagnosis not present

## 2019-09-24 DIAGNOSIS — K760 Fatty (change of) liver, not elsewhere classified: Secondary | ICD-10-CM | POA: Diagnosis not present

## 2019-09-24 DIAGNOSIS — M5412 Radiculopathy, cervical region: Secondary | ICD-10-CM | POA: Diagnosis not present

## 2019-09-24 DIAGNOSIS — Z23 Encounter for immunization: Secondary | ICD-10-CM | POA: Diagnosis not present

## 2019-09-24 DIAGNOSIS — N1831 Chronic kidney disease, stage 3a: Secondary | ICD-10-CM | POA: Diagnosis not present

## 2019-09-24 DIAGNOSIS — E782 Mixed hyperlipidemia: Secondary | ICD-10-CM | POA: Diagnosis not present

## 2019-09-24 DIAGNOSIS — D649 Anemia, unspecified: Secondary | ICD-10-CM | POA: Diagnosis not present

## 2019-09-24 DIAGNOSIS — L821 Other seborrheic keratosis: Secondary | ICD-10-CM | POA: Diagnosis not present

## 2019-09-24 DIAGNOSIS — N400 Enlarged prostate without lower urinary tract symptoms: Secondary | ICD-10-CM | POA: Diagnosis not present

## 2019-09-24 DIAGNOSIS — E039 Hypothyroidism, unspecified: Secondary | ICD-10-CM | POA: Diagnosis not present

## 2019-09-24 DIAGNOSIS — M109 Gout, unspecified: Secondary | ICD-10-CM | POA: Diagnosis not present

## 2019-10-21 DIAGNOSIS — G629 Polyneuropathy, unspecified: Secondary | ICD-10-CM | POA: Diagnosis not present

## 2019-10-21 DIAGNOSIS — M1009 Idiopathic gout, multiple sites: Secondary | ICD-10-CM | POA: Diagnosis not present

## 2019-10-21 DIAGNOSIS — R768 Other specified abnormal immunological findings in serum: Secondary | ICD-10-CM | POA: Diagnosis not present

## 2019-10-21 DIAGNOSIS — Z6823 Body mass index (BMI) 23.0-23.9, adult: Secondary | ICD-10-CM | POA: Diagnosis not present

## 2019-11-01 ENCOUNTER — Emergency Department (HOSPITAL_COMMUNITY)
Admission: EM | Admit: 2019-11-01 | Discharge: 2019-11-02 | Disposition: A | Discharge status: Home / Self Care | Payer: Medicare Other | Attending: Emergency Medicine | Admitting: Emergency Medicine

## 2019-11-01 ENCOUNTER — Other Ambulatory Visit: Payer: Self-pay

## 2019-11-01 ENCOUNTER — Encounter (HOSPITAL_COMMUNITY): Payer: Self-pay

## 2019-11-01 ENCOUNTER — Emergency Department (HOSPITAL_COMMUNITY): Payer: Medicare Other

## 2019-11-01 DIAGNOSIS — N183 Chronic kidney disease, stage 3 unspecified: Secondary | ICD-10-CM | POA: Diagnosis not present

## 2019-11-01 DIAGNOSIS — N390 Urinary tract infection, site not specified: Secondary | ICD-10-CM | POA: Diagnosis not present

## 2019-11-01 DIAGNOSIS — Z7982 Long term (current) use of aspirin: Secondary | ICD-10-CM | POA: Insufficient documentation

## 2019-11-01 DIAGNOSIS — I10 Essential (primary) hypertension: Secondary | ICD-10-CM | POA: Diagnosis not present

## 2019-11-01 DIAGNOSIS — I129 Hypertensive chronic kidney disease with stage 1 through stage 4 chronic kidney disease, or unspecified chronic kidney disease: Secondary | ICD-10-CM | POA: Diagnosis not present

## 2019-11-01 DIAGNOSIS — Z79899 Other long term (current) drug therapy: Secondary | ICD-10-CM | POA: Insufficient documentation

## 2019-11-01 DIAGNOSIS — R509 Fever, unspecified: Secondary | ICD-10-CM | POA: Diagnosis not present

## 2019-11-01 DIAGNOSIS — R300 Dysuria: Secondary | ICD-10-CM | POA: Diagnosis present

## 2019-11-01 LAB — URINALYSIS, ROUTINE W REFLEX MICROSCOPIC
Bilirubin Urine: NEGATIVE
Glucose, UA: NEGATIVE mg/dL
Ketones, ur: NEGATIVE mg/dL
Nitrite: NEGATIVE
Protein, ur: 30 mg/dL — AB
Specific Gravity, Urine: 1.014 (ref 1.005–1.030)
pH: 7 (ref 5.0–8.0)

## 2019-11-01 LAB — CBC WITH DIFFERENTIAL/PLATELET
Abs Immature Granulocytes: 0.09 10*3/uL — ABNORMAL HIGH (ref 0.00–0.07)
Basophils Absolute: 0.1 10*3/uL (ref 0.0–0.1)
Basophils Relative: 0 %
Eosinophils Absolute: 0.2 10*3/uL (ref 0.0–0.5)
Eosinophils Relative: 1 %
HCT: 38.1 % — ABNORMAL LOW (ref 39.0–52.0)
Hemoglobin: 13 g/dL (ref 13.0–17.0)
Immature Granulocytes: 1 %
Lymphocytes Relative: 16 %
Lymphs Abs: 2.9 10*3/uL (ref 0.7–4.0)
MCH: 32.5 pg (ref 26.0–34.0)
MCHC: 34.1 g/dL (ref 30.0–36.0)
MCV: 95.3 fL (ref 80.0–100.0)
Monocytes Absolute: 1.5 10*3/uL — ABNORMAL HIGH (ref 0.1–1.0)
Monocytes Relative: 8 %
Neutro Abs: 13.9 10*3/uL — ABNORMAL HIGH (ref 1.7–7.7)
Neutrophils Relative %: 74 %
Platelets: 223 10*3/uL (ref 150–400)
RBC: 4 MIL/uL — ABNORMAL LOW (ref 4.22–5.81)
RDW: 13.5 % (ref 11.5–15.5)
WBC: 18.7 10*3/uL — ABNORMAL HIGH (ref 4.0–10.5)
nRBC: 0 % (ref 0.0–0.2)

## 2019-11-01 LAB — COMPREHENSIVE METABOLIC PANEL
ALT: 42 U/L (ref 0–44)
AST: 35 U/L (ref 15–41)
Albumin: 4.2 g/dL (ref 3.5–5.0)
Alkaline Phosphatase: 112 U/L (ref 38–126)
Anion gap: 12 (ref 5–15)
BUN: 38 mg/dL — ABNORMAL HIGH (ref 8–23)
CO2: 22 mmol/L (ref 22–32)
Calcium: 9.7 mg/dL (ref 8.9–10.3)
Chloride: 106 mmol/L (ref 98–111)
Creatinine, Ser: 1.9 mg/dL — ABNORMAL HIGH (ref 0.61–1.24)
GFR calc Af Amer: 39 mL/min — ABNORMAL LOW (ref >60)
GFR calc non Af Amer: 34 mL/min — ABNORMAL LOW (ref >60)
Glucose, Bld: 121 mg/dL — ABNORMAL HIGH (ref 70–99)
Potassium: 5.2 mmol/L — ABNORMAL HIGH (ref 3.5–5.1)
Sodium: 140 mmol/L (ref 135–145)
Total Bilirubin: 0.8 mg/dL (ref 0.3–1.2)
Total Protein: 7.5 g/dL (ref 6.5–8.1)

## 2019-11-01 LAB — LACTIC ACID, PLASMA: Lactic Acid, Venous: 1.6 mmol/L (ref 0.5–1.9)

## 2019-11-01 LAB — PROTIME-INR
INR: 1.1 (ref 0.8–1.2)
Prothrombin Time: 13.7 seconds (ref 11.4–15.2)

## 2019-11-01 LAB — APTT: aPTT: 32 seconds (ref 24–36)

## 2019-11-01 MED ORDER — CEPHALEXIN 500 MG PO CAPS
500.0000 mg | ORAL_CAPSULE | Freq: Two times a day (BID) | ORAL | 0 refills | Status: DC
Start: 2019-11-01 — End: 2021-07-20

## 2019-11-01 MED ORDER — SODIUM CHLORIDE 0.9 % IV BOLUS
500.0000 mL | Freq: Once | INTRAVENOUS | Status: AC
  Administered 2019-11-01: 500 mL via INTRAVENOUS

## 2019-11-01 MED ORDER — SODIUM CHLORIDE 0.9 % IV SOLN
1.0000 g | Freq: Once | INTRAVENOUS | Status: AC
  Administered 2019-11-01: 1 g via INTRAVENOUS
  Filled 2019-11-01: qty 10

## 2019-11-01 NOTE — ED Provider Notes (Signed)
[facility] DEPT Provider Note   CSN: [id number] Arrival date & time: 11/01/19  1833     History Chief Complaint  Patient presents with  . Fever  . Nausea  . Dysuria    Neil Whitaker is a 74 y.o. male.  Patient is a 74 year old male who presents with fever.  He has a history of prior UTIs.  He says that this afternoon he started feeling little achy and started running a fever which he does not remember what the number was.  He went to his PCPs office and thought that he had a urinary tract infection.  He has had similar symptoms previously with a urinary tract infection.  He had a little bit of burning on urination and had 2 episodes of vomiting.  He was given a shot of something for nausea in the doctor's office and was told to come here for a dose of IV antibiotics.  He feels much better since he got the antiemetic.  He has had no ongoing vomiting.  He denies any dizziness.  No cough or cold symptoms.  No shaking chills.        Past Medical History:  Diagnosis Date  . ANA positive   . Benign hypertension with CKD (chronic kidney disease) stage III 1966  . BPH (benign prostatic hyperplasia)   . CKD (chronic kidney disease), stage III   . CKD (chronic kidney disease), stage III   . 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 Elevated blood uric acid level   . Gout of big toe   . H/O prostatitis   . Hearing loss    BILATERAL HEARING AID  . History of elevated PSA   . Hypercholesterolemia   . Hypertension   . Low back pain   . Subclinical hypothyroidism   . Synovitis of wrist     There are no problems to display for this patient.   Past Surgical History:  Procedure Laterality Date  . APPENDECTOMY    . HEMORRHOIDECTOMY WITH HEMORRHOID BANDING         Family History  Problem Relation Age of Onset  . Cancer Mother        KIDNEY  . Gout Father   . Lupus Sister   . CVA Paternal Grandmother   . Heart  attack Paternal Grandfather     Social History   Tobacco Use  . Smoking status: Never Smoker  . Smokeless tobacco: Never Used  Substance Use Topics  . Alcohol use: Yes    Comment: RARE  . Drug use: No    Home Medications Prior to Admission medications   Medication Sig Start Date End Date Taking? Authorizing Provider  allopurinol (ZYLOPRIM) 100 MG tablet Take 200 mg by mouth daily. 08/20/19  Yes [provider]  amLODipine (NORVASC) 10 MG tablet Take 10 mg by mouth daily.   Yes [provider]  aspirin EC 81 MG tablet Take 81 mg by mouth daily.   Yes [provider]  carvedilol (COREG) 12.5 MG tablet Take 1 tablet (12.5 mg total) by mouth 2 (two) times daily. 09/08/15  Yes Camnitz, Will Hassell Done, MD  finasteride (PROSCAR) 5 MG tablet Take 5 mg by mouth daily. 09/19/19  Yes [provider]  lisinopril (PRINIVIL,ZESTRIL) 20 MG tablet Take 20 mg by mouth daily.   Yes [provider]  Omega-3 Fatty Acids (FISH OIL) 1000 MG CAPS Take 1 capsule by mouth daily.  Yes [provider]  tamsulosin (FLOMAX) 0.4 MG CAPS capsule Take 0.4 mg by mouth daily. 09/19/19  Yes [provider]  cephALEXin (KEFLEX) 500 MG capsule Take 1 capsule (500 mg total) by mouth 2 (two) times daily. 11/01/19   Malvin Johns, MD    Allergies    Penicillins  Review of Systems   Review of Systems  Constitutional: Positive for fatigue and fever. Negative for chills and diaphoresis.  HENT: Negative for congestion, rhinorrhea and sneezing.   Eyes: Negative.   Respiratory: Negative for cough, chest tightness and shortness of breath.   Cardiovascular: Negative for chest pain and leg swelling.  Gastrointestinal: Positive for nausea and vomiting. Negative for abdominal pain, blood in stool and diarrhea.  Genitourinary: Positive for dysuria. Negative for difficulty urinating, flank pain, frequency and hematuria.  Musculoskeletal: Negative for arthralgias and back  pain.  Skin: Negative for rash.  Neurological: Negative for dizziness, speech difficulty, weakness, numbness and headaches.    Physical Exam Updated Vital Signs BP 110/69   Pulse 81   Temp 99.3 F (37.4 C)   Resp 13   Ht 6' (1.829 m)   Wt 80.7 kg   SpO2 94%   BMI 24.14 kg/m   Physical Exam Constitutional:      Appearance: He is well-developed.  HENT:     Head: Normocephalic and atraumatic.  Eyes:     Pupils: Pupils are equal, round, and reactive to light.  Cardiovascular:     Rate and Rhythm: Normal rate and regular rhythm.     Heart sounds: Normal heart sounds.  Pulmonary:     Effort: Pulmonary effort is normal. No respiratory distress.     Breath sounds: Normal breath sounds. No wheezing or rales.  Chest:     Chest wall: No tenderness.  Abdominal:     General: Bowel sounds are normal.     Palpations: Abdomen is soft.     Tenderness: There is no abdominal tenderness. There is no guarding or rebound.  Musculoskeletal:        General: Normal range of motion.     Cervical back: Normal range of motion and neck supple.  Lymphadenopathy:     Cervical: No cervical adenopathy.  Skin:    General: Skin is warm and dry.     Findings: No rash.  Neurological:     Mental Status: He is alert and oriented to person, place, and time.     ED Results / Procedures / Treatments   Labs (all labs ordered are listed, but only abnormal results are displayed) Labs Reviewed  URINALYSIS, ROUTINE W REFLEX MICROSCOPIC - Abnormal; Notable for the following components:      Result Value   Hgb urine dipstick SMALL (*)    Protein, ur 30 (*)    Leukocytes,Ua MODERATE (*)    Bacteria, UA RARE (*)    All other components within normal limits  COMPREHENSIVE METABOLIC PANEL - Abnormal; Notable for the following components:   Potassium 5.2 (*)    Glucose, Bld 121 (*)    BUN 38 (*)    Creatinine, Ser 1.90 (*)    GFR calc non Af Amer 34 (*)    GFR calc Af Amer 39 (*)    All other  components within normal limits  CBC WITH DIFFERENTIAL/PLATELET - Abnormal; Notable for the following components:   WBC 18.7 (*)    RBC 4.00 (*)    HCT 38.1 (*)    Neutro Abs 13.9 (*)  Monocytes Absolute 1.5 (*)    Abs Immature Granulocytes 0.09 (*)    All other components within normal limits  CULTURE, BLOOD (ROUTINE X 2)  CULTURE, BLOOD (ROUTINE X 2)  URINE CULTURE  LACTIC ACID, PLASMA  APTT  PROTIME-INR    EKG EKG Interpretation  Date/Time:  Friday Nov 01 2019 20:41:04 EDT Ventricular Rate:  86 PR Interval:    QRS Duration: 74 QT Interval:  329 QTC Calculation: 394 R Axis:   43 Text Interpretation: Sinus rhythm Low voltage, precordial leads No old tracing to compare Confirmed by Malvin Johns [phone]) on 11/01/2019 10:55:46 PM   Radiology DG Chest Port 1 View  Result Date: 11/01/2019 CLINICAL DATA:  Fever, burning with urination, nausea EXAM: PORTABLE CHEST 1 VIEW COMPARISON:  None. FINDINGS: No consolidation, features of edema, pneumothorax, or effusion. Pulmonary vascularity is normally distributed. The cardiomediastinal contours are unremarkable. No acute osseous or soft tissue abnormality. Mild degenerative changes in the left shoulder. Telemetry leads overlie the chest. IMPRESSION: No acute cardiopulmonary abnormality. Electronically Signed   By: Lovena Le M.D.   On: 11/01/2019 20:25    Procedures Procedures (including critical care time)  Medications Ordered in ED Medications  cefTRIAXone (ROCEPHIN) 1 g in sodium chloride 0.9 % 100 mL IVPB (0 g Intravenous Stopped 11/01/19 2211)  sodium chloride 0.9 % bolus 500 mL (0 mLs Intravenous Stopped 11/01/19 2330)    ED Course  I have reviewed the triage vital signs and the nursing notes.  Pertinent labs & imaging results that were available during my care of the patient were reviewed by me and considered in my medical decision making (see chart for details).    MDM Rules/Calculators/A&P                       Patient is a 74 year old male who presents with fever and dysuria.  His urine is consistent with infection.  His lactate is normal and he does not have any hypotension or other suggestions of sepsis.  No tachycardia.  He feels much better since being in the emergency department.  He has had no vomiting.  He is tolerating oral fluids.  He was given dose of IV Rocephin.  He has no associated abdominal pain or other clinical concerns for kidney stones.  He was discharged home in good condition.  His urine was sent for culture.  He was started on Keflex.  He has a remote penicillin allergy which he says was a small rash on his chest.  No suggestions of anaphylaxis.  His creatinine is mildly elevated at 1.9.  His potassium is just out of the normal range at 5.2.  I do not have any old labs recently to review from him.  I encouraged him to contact his doctor on Monday to have his labs rechecked.  He was given strict return precautions. Final Clinical Impression(s) / ED Diagnoses Final diagnoses:  Lower urinary tract infectious disease    Rx / DC Orders ED Discharge Orders         Ordered    cephALEXin (KEFLEX) 500 MG capsule  2 times daily     11/01/19 2348           Malvin Johns, MD 11/02/19 0006

## 2019-11-01 NOTE — ED Triage Notes (Signed)
Patient reports that he had a fever of 102.5, burning when urinating, and nausea. Patient was sent to the ED for possible UTI. Patient states he received a shot for vomiting at the PCP office.

## 2019-11-01 NOTE — Discharge Instructions (Addendum)
Take the antibiotics as directed.  Your creatinine was elevated to 1.9.  This needs to be rechecked by your primary care provider.  Return here as needed if you have any worsening symptoms.

## 2019-11-04 LAB — URINE CULTURE: Culture: >=100000 — AB

## 2019-11-05 ENCOUNTER — Telehealth: Payer: Self-pay | Admitting: *Deleted

## 2019-11-05 NOTE — Telephone Encounter (Signed)
Post ED Visit - Positive Culture Follow-up  Culture report reviewed by antimicrobial stewardship pharmacist: [facility] Team [x]  Elenor Quinones, Pharm.D. []  Heide Guile, Pharm.D., BCPS AQ-ID []  Parks Neptune, Pharm.D., BCPS []  Alycia Rossetti, Pharm.D., BCPS []  [place], Pharm.D., BCPS, AAHIVP []  Legrand Como, Pharm.D., BCPS, AAHIVP []  Salome Arnt, PharmD, BCPS []  Johnnette Gourd, PharmD, BCPS []  Hughes Better, PharmD, BCPS []  Leeroy Cha, PharmD []  Laqueta Linden, PharmD, BCPS []  Albertina Parr, PharmD  [facility] Team []  Leodis Sias, PharmD []  Lindell Spar, PharmD []  Royetta Asal, PharmD []  Graylin Shiver, Rph []  Rema Fendt) Glennon Mac, PharmD []  Arlyn Dunning, PharmD []  Netta Cedars, PharmD []  Dia Sitter, PharmD []  Leone Haven, PharmD []  Gretta Arab, PharmD []  Theodis Shove, PharmD []  Peggyann Juba, PharmD []  Reuel Boom, PharmD   Positive urine culture Treated with Cephalexin, organism sensitive to the same and no further patient follow-up is required at this time.  Harlon Flor [facility] 11/05/2019, 9:07 AM

## 2019-11-07 LAB — CULTURE, BLOOD (ROUTINE X 2)
Culture: NO GROWTH
Culture: NO GROWTH

## 2019-11-25 DIAGNOSIS — R202 Paresthesia of skin: Secondary | ICD-10-CM | POA: Diagnosis not present

## 2019-11-25 DIAGNOSIS — I10 Essential (primary) hypertension: Secondary | ICD-10-CM | POA: Diagnosis not present

## 2020-01-10 DIAGNOSIS — K760 Fatty (change of) liver, not elsewhere classified: Secondary | ICD-10-CM | POA: Diagnosis not present

## 2020-01-10 DIAGNOSIS — E782 Mixed hyperlipidemia: Secondary | ICD-10-CM | POA: Diagnosis not present

## 2020-01-10 DIAGNOSIS — I10 Essential (primary) hypertension: Secondary | ICD-10-CM | POA: Diagnosis not present

## 2020-01-10 DIAGNOSIS — N1831 Chronic kidney disease, stage 3a: Secondary | ICD-10-CM | POA: Diagnosis not present

## 2020-01-10 DIAGNOSIS — M5412 Radiculopathy, cervical region: Secondary | ICD-10-CM | POA: Diagnosis not present

## 2020-01-10 DIAGNOSIS — N400 Enlarged prostate without lower urinary tract symptoms: Secondary | ICD-10-CM | POA: Diagnosis not present

## 2020-01-10 DIAGNOSIS — E039 Hypothyroidism, unspecified: Secondary | ICD-10-CM | POA: Diagnosis not present

## 2020-01-10 DIAGNOSIS — D649 Anemia, unspecified: Secondary | ICD-10-CM | POA: Diagnosis not present

## 2020-01-10 DIAGNOSIS — Z87440 Personal history of urinary (tract) infections: Secondary | ICD-10-CM | POA: Diagnosis not present

## 2020-01-10 DIAGNOSIS — M109 Gout, unspecified: Secondary | ICD-10-CM | POA: Diagnosis not present

## 2020-02-04 DIAGNOSIS — N401 Enlarged prostate with lower urinary tract symptoms: Secondary | ICD-10-CM | POA: Diagnosis not present

## 2020-02-04 DIAGNOSIS — R350 Frequency of micturition: Secondary | ICD-10-CM | POA: Diagnosis not present

## 2020-02-04 DIAGNOSIS — N3000 Acute cystitis without hematuria: Secondary | ICD-10-CM | POA: Diagnosis not present

## 2020-02-10 DIAGNOSIS — N3000 Acute cystitis without hematuria: Secondary | ICD-10-CM | POA: Diagnosis not present

## 2020-02-11 DIAGNOSIS — N200 Calculus of kidney: Secondary | ICD-10-CM | POA: Diagnosis not present

## 2020-02-11 DIAGNOSIS — N401 Enlarged prostate with lower urinary tract symptoms: Secondary | ICD-10-CM | POA: Diagnosis not present

## 2020-02-11 DIAGNOSIS — N281 Cyst of kidney, acquired: Secondary | ICD-10-CM | POA: Diagnosis not present

## 2020-02-11 DIAGNOSIS — R350 Frequency of micturition: Secondary | ICD-10-CM | POA: Diagnosis not present

## 2020-02-12 DIAGNOSIS — Z23 Encounter for immunization: Secondary | ICD-10-CM | POA: Diagnosis not present

## 2020-02-20 DIAGNOSIS — N3000 Acute cystitis without hematuria: Secondary | ICD-10-CM | POA: Diagnosis not present

## 2020-02-20 DIAGNOSIS — N200 Calculus of kidney: Secondary | ICD-10-CM | POA: Diagnosis not present

## 2020-02-20 DIAGNOSIS — N3289 Other specified disorders of bladder: Secondary | ICD-10-CM | POA: Diagnosis not present

## 2020-03-04 DIAGNOSIS — N281 Cyst of kidney, acquired: Secondary | ICD-10-CM | POA: Diagnosis not present

## 2020-03-04 DIAGNOSIS — K8020 Calculus of gallbladder without cholecystitis without obstruction: Secondary | ICD-10-CM | POA: Diagnosis not present

## 2020-03-04 DIAGNOSIS — N400 Enlarged prostate without lower urinary tract symptoms: Secondary | ICD-10-CM | POA: Diagnosis not present

## 2020-03-04 DIAGNOSIS — N200 Calculus of kidney: Secondary | ICD-10-CM | POA: Diagnosis not present

## 2020-03-10 DIAGNOSIS — Z23 Encounter for immunization: Secondary | ICD-10-CM | POA: Diagnosis not present

## 2020-03-16 DIAGNOSIS — N401 Enlarged prostate with lower urinary tract symptoms: Secondary | ICD-10-CM | POA: Diagnosis not present

## 2020-03-16 DIAGNOSIS — R350 Frequency of micturition: Secondary | ICD-10-CM | POA: Diagnosis not present

## 2020-03-16 DIAGNOSIS — N200 Calculus of kidney: Secondary | ICD-10-CM | POA: Diagnosis not present

## 2020-03-16 DIAGNOSIS — Z125 Encounter for screening for malignant neoplasm of prostate: Secondary | ICD-10-CM | POA: Diagnosis not present

## 2020-03-16 DIAGNOSIS — N281 Cyst of kidney, acquired: Secondary | ICD-10-CM | POA: Diagnosis not present

## 2020-03-26 DIAGNOSIS — I700 Atherosclerosis of aorta: Secondary | ICD-10-CM | POA: Diagnosis not present

## 2020-03-26 DIAGNOSIS — E875 Hyperkalemia: Secondary | ICD-10-CM | POA: Diagnosis not present

## 2020-03-26 DIAGNOSIS — D649 Anemia, unspecified: Secondary | ICD-10-CM | POA: Diagnosis not present

## 2020-03-26 DIAGNOSIS — I10 Essential (primary) hypertension: Secondary | ICD-10-CM | POA: Diagnosis not present

## 2020-03-26 DIAGNOSIS — K760 Fatty (change of) liver, not elsewhere classified: Secondary | ICD-10-CM | POA: Diagnosis not present

## 2020-03-26 DIAGNOSIS — M109 Gout, unspecified: Secondary | ICD-10-CM | POA: Diagnosis not present

## 2020-03-26 DIAGNOSIS — N400 Enlarged prostate without lower urinary tract symptoms: Secondary | ICD-10-CM | POA: Diagnosis not present

## 2020-03-26 DIAGNOSIS — K869 Disease of pancreas, unspecified: Secondary | ICD-10-CM | POA: Diagnosis not present

## 2020-03-26 DIAGNOSIS — M5432 Sciatica, left side: Secondary | ICD-10-CM | POA: Diagnosis not present

## 2020-03-26 DIAGNOSIS — N1831 Chronic kidney disease, stage 3a: Secondary | ICD-10-CM | POA: Diagnosis not present

## 2020-03-26 DIAGNOSIS — E039 Hypothyroidism, unspecified: Secondary | ICD-10-CM | POA: Diagnosis not present

## 2020-03-26 DIAGNOSIS — E782 Mixed hyperlipidemia: Secondary | ICD-10-CM | POA: Diagnosis not present

## 2020-04-07 DIAGNOSIS — E875 Hyperkalemia: Secondary | ICD-10-CM | POA: Diagnosis not present

## 2020-04-22 DIAGNOSIS — R768 Other specified abnormal immunological findings in serum: Secondary | ICD-10-CM | POA: Diagnosis not present

## 2020-04-22 DIAGNOSIS — M1009 Idiopathic gout, multiple sites: Secondary | ICD-10-CM | POA: Diagnosis not present

## 2020-04-22 DIAGNOSIS — Z6822 Body mass index (BMI) 22.0-22.9, adult: Secondary | ICD-10-CM | POA: Diagnosis not present

## 2020-04-22 DIAGNOSIS — Z79899 Other long term (current) drug therapy: Secondary | ICD-10-CM | POA: Diagnosis not present

## 2020-04-22 DIAGNOSIS — M542 Cervicalgia: Secondary | ICD-10-CM | POA: Diagnosis not present

## 2020-04-23 DIAGNOSIS — Z1152 Encounter for screening for COVID-19: Secondary | ICD-10-CM | POA: Diagnosis not present

## 2020-05-06 DIAGNOSIS — E039 Hypothyroidism, unspecified: Secondary | ICD-10-CM | POA: Diagnosis not present

## 2020-05-19 DIAGNOSIS — L308 Other specified dermatitis: Secondary | ICD-10-CM | POA: Diagnosis not present

## 2020-06-19 DIAGNOSIS — S0500XA Injury of conjunctiva and corneal abrasion without foreign body, unspecified eye, initial encounter: Secondary | ICD-10-CM | POA: Diagnosis not present

## 2020-06-19 DIAGNOSIS — H02201 Unspecified lagophthalmos right upper eyelid: Secondary | ICD-10-CM | POA: Diagnosis not present

## 2020-07-16 DIAGNOSIS — Z1152 Encounter for screening for COVID-19: Secondary | ICD-10-CM | POA: Diagnosis not present

## 2020-10-05 DIAGNOSIS — I10 Essential (primary) hypertension: Secondary | ICD-10-CM | POA: Diagnosis not present

## 2020-10-05 DIAGNOSIS — E782 Mixed hyperlipidemia: Secondary | ICD-10-CM | POA: Diagnosis not present

## 2020-10-05 DIAGNOSIS — N1831 Chronic kidney disease, stage 3a: Secondary | ICD-10-CM | POA: Diagnosis not present

## 2020-10-05 DIAGNOSIS — Z0000 Encounter for general adult medical examination without abnormal findings: Secondary | ICD-10-CM | POA: Diagnosis not present

## 2020-10-05 DIAGNOSIS — K869 Disease of pancreas, unspecified: Secondary | ICD-10-CM | POA: Diagnosis not present

## 2020-10-05 DIAGNOSIS — K760 Fatty (change of) liver, not elsewhere classified: Secondary | ICD-10-CM | POA: Diagnosis not present

## 2020-10-05 DIAGNOSIS — E039 Hypothyroidism, unspecified: Secondary | ICD-10-CM | POA: Diagnosis not present

## 2020-10-05 DIAGNOSIS — I700 Atherosclerosis of aorta: Secondary | ICD-10-CM | POA: Diagnosis not present

## 2020-10-05 DIAGNOSIS — D649 Anemia, unspecified: Secondary | ICD-10-CM | POA: Diagnosis not present

## 2020-10-05 DIAGNOSIS — N400 Enlarged prostate without lower urinary tract symptoms: Secondary | ICD-10-CM | POA: Diagnosis not present

## 2020-10-05 DIAGNOSIS — M109 Gout, unspecified: Secondary | ICD-10-CM | POA: Diagnosis not present

## 2020-10-05 DIAGNOSIS — Z1389 Encounter for screening for other disorder: Secondary | ICD-10-CM | POA: Diagnosis not present

## 2020-10-15 ENCOUNTER — Other Ambulatory Visit: Payer: Self-pay | Admitting: Family Medicine

## 2020-10-15 DIAGNOSIS — K869 Disease of pancreas, unspecified: Secondary | ICD-10-CM

## 2020-10-21 DIAGNOSIS — R768 Other specified abnormal immunological findings in serum: Secondary | ICD-10-CM | POA: Diagnosis not present

## 2020-10-21 DIAGNOSIS — G629 Polyneuropathy, unspecified: Secondary | ICD-10-CM | POA: Diagnosis not present

## 2020-10-21 DIAGNOSIS — Z6822 Body mass index (BMI) 22.0-22.9, adult: Secondary | ICD-10-CM | POA: Diagnosis not present

## 2020-10-21 DIAGNOSIS — M1009 Idiopathic gout, multiple sites: Secondary | ICD-10-CM | POA: Diagnosis not present

## 2020-10-21 DIAGNOSIS — Z79899 Other long term (current) drug therapy: Secondary | ICD-10-CM | POA: Diagnosis not present

## 2020-10-27 DIAGNOSIS — Z23 Encounter for immunization: Secondary | ICD-10-CM | POA: Diagnosis not present

## 2020-11-02 ENCOUNTER — Ambulatory Visit
Admission: RE | Admit: 2020-11-02 | Discharge: 2020-11-02 | Disposition: A | Discharge status: Home / Self Care | Payer: Medicare Other | Source: Ambulatory Visit | Attending: Family Medicine | Admitting: Family Medicine

## 2020-11-02 ENCOUNTER — Other Ambulatory Visit: Payer: Self-pay

## 2020-11-02 DIAGNOSIS — K862 Cyst of pancreas: Secondary | ICD-10-CM | POA: Diagnosis not present

## 2020-11-02 DIAGNOSIS — K8080 Other cholelithiasis without obstruction: Secondary | ICD-10-CM | POA: Diagnosis not present

## 2020-11-02 DIAGNOSIS — N281 Cyst of kidney, acquired: Secondary | ICD-10-CM | POA: Diagnosis not present

## 2020-11-02 DIAGNOSIS — K8020 Calculus of gallbladder without cholecystitis without obstruction: Secondary | ICD-10-CM | POA: Diagnosis not present

## 2020-11-02 DIAGNOSIS — K869 Disease of pancreas, unspecified: Secondary | ICD-10-CM

## 2020-11-02 MED ORDER — GADOBENATE DIMEGLUMINE 529 MG/ML IV SOLN
15.0000 mL | Freq: Once | INTRAVENOUS | Status: AC | PRN
  Administered 2020-11-02: 15 mL via INTRAVENOUS

## 2021-03-11 DIAGNOSIS — Z23 Encounter for immunization: Secondary | ICD-10-CM | POA: Diagnosis not present

## 2021-03-11 DIAGNOSIS — N401 Enlarged prostate with lower urinary tract symptoms: Secondary | ICD-10-CM | POA: Diagnosis not present

## 2021-03-18 DIAGNOSIS — N401 Enlarged prostate with lower urinary tract symptoms: Secondary | ICD-10-CM | POA: Diagnosis not present

## 2021-03-18 DIAGNOSIS — N281 Cyst of kidney, acquired: Secondary | ICD-10-CM | POA: Diagnosis not present

## 2021-03-18 DIAGNOSIS — R350 Frequency of micturition: Secondary | ICD-10-CM | POA: Diagnosis not present

## 2021-03-18 DIAGNOSIS — N200 Calculus of kidney: Secondary | ICD-10-CM | POA: Diagnosis not present

## 2021-04-06 DIAGNOSIS — N1831 Chronic kidney disease, stage 3a: Secondary | ICD-10-CM | POA: Diagnosis not present

## 2021-04-06 DIAGNOSIS — R202 Paresthesia of skin: Secondary | ICD-10-CM | POA: Diagnosis not present

## 2021-04-06 DIAGNOSIS — K760 Fatty (change of) liver, not elsewhere classified: Secondary | ICD-10-CM | POA: Diagnosis not present

## 2021-04-06 DIAGNOSIS — M109 Gout, unspecified: Secondary | ICD-10-CM | POA: Diagnosis not present

## 2021-04-06 DIAGNOSIS — N400 Enlarged prostate without lower urinary tract symptoms: Secondary | ICD-10-CM | POA: Diagnosis not present

## 2021-04-06 DIAGNOSIS — K869 Disease of pancreas, unspecified: Secondary | ICD-10-CM | POA: Diagnosis not present

## 2021-04-06 DIAGNOSIS — I10 Essential (primary) hypertension: Secondary | ICD-10-CM | POA: Diagnosis not present

## 2021-04-06 DIAGNOSIS — E782 Mixed hyperlipidemia: Secondary | ICD-10-CM | POA: Diagnosis not present

## 2021-04-06 DIAGNOSIS — D649 Anemia, unspecified: Secondary | ICD-10-CM | POA: Diagnosis not present

## 2021-04-06 DIAGNOSIS — I700 Atherosclerosis of aorta: Secondary | ICD-10-CM | POA: Diagnosis not present

## 2021-04-06 DIAGNOSIS — E039 Hypothyroidism, unspecified: Secondary | ICD-10-CM | POA: Diagnosis not present

## 2021-04-23 DIAGNOSIS — M1009 Idiopathic gout, multiple sites: Secondary | ICD-10-CM | POA: Diagnosis not present

## 2021-04-23 DIAGNOSIS — M539 Dorsopathy, unspecified: Secondary | ICD-10-CM | POA: Diagnosis not present

## 2021-04-23 DIAGNOSIS — Z6823 Body mass index (BMI) 23.0-23.9, adult: Secondary | ICD-10-CM | POA: Diagnosis not present

## 2021-04-23 DIAGNOSIS — R768 Other specified abnormal immunological findings in serum: Secondary | ICD-10-CM | POA: Diagnosis not present

## 2021-04-23 DIAGNOSIS — Z79899 Other long term (current) drug therapy: Secondary | ICD-10-CM | POA: Diagnosis not present

## 2021-04-27 ENCOUNTER — Other Ambulatory Visit: Payer: Self-pay | Admitting: Family Medicine

## 2021-04-27 DIAGNOSIS — K869 Disease of pancreas, unspecified: Secondary | ICD-10-CM

## 2021-04-30 DIAGNOSIS — H04123 Dry eye syndrome of bilateral lacrimal glands: Secondary | ICD-10-CM | POA: Diagnosis not present

## 2021-04-30 DIAGNOSIS — H524 Presbyopia: Secondary | ICD-10-CM | POA: Diagnosis not present

## 2021-04-30 DIAGNOSIS — H5203 Hypermetropia, bilateral: Secondary | ICD-10-CM | POA: Diagnosis not present

## 2021-04-30 DIAGNOSIS — H52223 Regular astigmatism, bilateral: Secondary | ICD-10-CM | POA: Diagnosis not present

## 2021-04-30 DIAGNOSIS — Z961 Presence of intraocular lens: Secondary | ICD-10-CM | POA: Diagnosis not present

## 2021-05-22 ENCOUNTER — Other Ambulatory Visit: Payer: Medicare Other

## 2021-05-24 ENCOUNTER — Ambulatory Visit
Admission: RE | Admit: 2021-05-24 | Discharge: 2021-05-24 | Disposition: A | Discharge status: Home / Self Care | Payer: Medicare Other | Source: Ambulatory Visit | Attending: Family Medicine | Admitting: Family Medicine

## 2021-05-24 DIAGNOSIS — K869 Disease of pancreas, unspecified: Secondary | ICD-10-CM

## 2021-05-24 DIAGNOSIS — N281 Cyst of kidney, acquired: Secondary | ICD-10-CM | POA: Diagnosis not present

## 2021-05-24 DIAGNOSIS — K760 Fatty (change of) liver, not elsewhere classified: Secondary | ICD-10-CM | POA: Diagnosis not present

## 2021-05-24 DIAGNOSIS — K862 Cyst of pancreas: Secondary | ICD-10-CM | POA: Diagnosis not present

## 2021-05-24 DIAGNOSIS — K8020 Calculus of gallbladder without cholecystitis without obstruction: Secondary | ICD-10-CM | POA: Diagnosis not present

## 2021-05-24 MED ORDER — GADOBENATE DIMEGLUMINE 529 MG/ML IV SOLN
16.0000 mL | Freq: Once | INTRAVENOUS | Status: AC | PRN
  Administered 2021-05-24: 16 mL via INTRAVENOUS

## 2021-06-10 ENCOUNTER — Other Ambulatory Visit: Payer: Self-pay | Admitting: Family Medicine

## 2021-06-10 DIAGNOSIS — R202 Paresthesia of skin: Secondary | ICD-10-CM

## 2021-06-10 DIAGNOSIS — M6281 Muscle weakness (generalized): Secondary | ICD-10-CM | POA: Diagnosis not present

## 2021-06-10 DIAGNOSIS — R29898 Other symptoms and signs involving the musculoskeletal system: Secondary | ICD-10-CM

## 2021-06-24 DIAGNOSIS — R29898 Other symptoms and signs involving the musculoskeletal system: Secondary | ICD-10-CM | POA: Diagnosis not present

## 2021-06-24 DIAGNOSIS — M5432 Sciatica, left side: Secondary | ICD-10-CM | POA: Diagnosis not present

## 2021-06-30 ENCOUNTER — Ambulatory Visit
Admission: RE | Admit: 2021-06-30 | Discharge: 2021-06-30 | Disposition: A | Discharge status: Home / Self Care | Payer: Medicare Other | Source: Ambulatory Visit | Attending: Family Medicine | Admitting: Family Medicine

## 2021-06-30 ENCOUNTER — Other Ambulatory Visit: Payer: Self-pay

## 2021-06-30 DIAGNOSIS — R29898 Other symptoms and signs involving the musculoskeletal system: Secondary | ICD-10-CM

## 2021-06-30 DIAGNOSIS — M4699 Unspecified inflammatory spondylopathy, multiple sites in spine: Secondary | ICD-10-CM | POA: Diagnosis not present

## 2021-06-30 DIAGNOSIS — M48061 Spinal stenosis, lumbar region without neurogenic claudication: Secondary | ICD-10-CM | POA: Diagnosis not present

## 2021-06-30 DIAGNOSIS — R531 Weakness: Secondary | ICD-10-CM | POA: Diagnosis not present

## 2021-06-30 DIAGNOSIS — R200 Anesthesia of skin: Secondary | ICD-10-CM

## 2021-06-30 DIAGNOSIS — M5126 Other intervertebral disc displacement, lumbar region: Secondary | ICD-10-CM | POA: Diagnosis not present

## 2021-06-30 MED ORDER — GADOBENATE DIMEGLUMINE 529 MG/ML IV SOLN
16.0000 mL | Freq: Once | INTRAVENOUS | Status: AC | PRN
  Administered 2021-06-30: 16 mL via INTRAVENOUS

## 2021-07-05 DIAGNOSIS — M48062 Spinal stenosis, lumbar region with neurogenic claudication: Secondary | ICD-10-CM | POA: Diagnosis not present

## 2021-07-05 DIAGNOSIS — M4802 Spinal stenosis, cervical region: Secondary | ICD-10-CM | POA: Diagnosis not present

## 2021-07-05 DIAGNOSIS — M48061 Spinal stenosis, lumbar region without neurogenic claudication: Secondary | ICD-10-CM | POA: Diagnosis not present

## 2021-07-06 ENCOUNTER — Other Ambulatory Visit: Payer: Self-pay | Admitting: Neurosurgery

## 2021-07-06 DIAGNOSIS — M4802 Spinal stenosis, cervical region: Secondary | ICD-10-CM

## 2021-07-06 DIAGNOSIS — L308 Other specified dermatitis: Secondary | ICD-10-CM | POA: Diagnosis not present

## 2021-07-09 ENCOUNTER — Other Ambulatory Visit: Payer: Self-pay | Admitting: Neurosurgery

## 2021-07-23 ENCOUNTER — Encounter (HOSPITAL_COMMUNITY)
Admission: RE | Admit: 2021-07-23 | Discharge: 2021-07-23 | Disposition: A | Discharge status: Home / Self Care | Payer: Medicare Other | Source: Ambulatory Visit | Attending: Neurosurgery | Admitting: Neurosurgery

## 2021-07-23 ENCOUNTER — Other Ambulatory Visit: Payer: Self-pay

## 2021-07-23 ENCOUNTER — Encounter (HOSPITAL_COMMUNITY): Payer: Self-pay

## 2021-07-23 VITALS — BP 122/79 | HR 67 | Temp 97.5°F | Resp 17 | Ht 72.0 in | Wt 174.6 lb

## 2021-07-23 DIAGNOSIS — Z20822 Contact with and (suspected) exposure to covid-19: Secondary | ICD-10-CM | POA: Diagnosis not present

## 2021-07-23 DIAGNOSIS — R7303 Prediabetes: Secondary | ICD-10-CM | POA: Insufficient documentation

## 2021-07-23 DIAGNOSIS — I2510 Atherosclerotic heart disease of native coronary artery without angina pectoris: Secondary | ICD-10-CM | POA: Diagnosis not present

## 2021-07-23 DIAGNOSIS — Z01818 Encounter for other preprocedural examination: Secondary | ICD-10-CM | POA: Insufficient documentation

## 2021-07-23 HISTORY — DX: Prediabetes: R73.03

## 2021-07-23 HISTORY — DX: Personal history of urinary calculi: Z87.442

## 2021-07-23 LAB — CBC
HCT: 38.4 % — ABNORMAL LOW (ref 39.0–52.0)
Hemoglobin: 12.7 g/dL — ABNORMAL LOW (ref 13.0–17.0)
MCH: 31.4 pg (ref 26.0–34.0)
MCHC: 33.1 g/dL (ref 30.0–36.0)
MCV: 95 fL (ref 80.0–100.0)
Platelets: 211 10*3/uL (ref 150–400)
RBC: 4.04 MIL/uL — ABNORMAL LOW (ref 4.22–5.81)
RDW: 13.8 % (ref 11.5–15.5)
WBC: 13.4 10*3/uL — ABNORMAL HIGH (ref 4.0–10.5)
nRBC: 0 % (ref 0.0–0.2)

## 2021-07-23 LAB — TYPE AND SCREEN
ABO/RH(D): O POS
Antibody Screen: NEGATIVE

## 2021-07-23 LAB — BASIC METABOLIC PANEL
Anion gap: 11 (ref 5–15)
BUN: 37 mg/dL — ABNORMAL HIGH (ref 8–23)
CO2: 22 mmol/L (ref 22–32)
Calcium: 10.1 mg/dL (ref 8.9–10.3)
Chloride: 106 mmol/L (ref 98–111)
Creatinine, Ser: 1.88 mg/dL — ABNORMAL HIGH (ref 0.61–1.24)
GFR, Estimated: 36 mL/min — ABNORMAL LOW (ref >60)
Glucose, Bld: 125 mg/dL — ABNORMAL HIGH (ref 70–99)
Potassium: 4.9 mmol/L (ref 3.5–5.1)
Sodium: 139 mmol/L (ref 135–145)

## 2021-07-23 LAB — SURGICAL PCR SCREEN
MRSA, PCR: POSITIVE — AB
Staphylococcus aureus: POSITIVE — AB

## 2021-07-23 LAB — HEMOGLOBIN A1C
Hgb A1c MFr Bld: 7.3 % — ABNORMAL HIGH (ref 4.8–5.6)
Mean Plasma Glucose: 162.81 mg/dL

## 2021-07-23 LAB — GLUCOSE, CAPILLARY: Glucose-Capillary: 128 mg/dL — ABNORMAL HIGH (ref 70–99)

## 2021-07-23 LAB — SARS CORONAVIRUS 2 (TAT 6-24 HRS): SARS Coronavirus 2: NEGATIVE

## 2021-07-23 NOTE — Progress Notes (Signed)
Staff message sent to the Brien Few with Dr. Marcello Moores' office regarding positive MRSA/STAPH pcr results.

## 2021-07-23 NOTE — Progress Notes (Signed)
Surgical Instructions    Your procedure is scheduled on Tuesday,July 27 2021.  Report to [facility] Main Entrance "A" at 5:30 A.M., then check in with the Admitting office.  Call this number if you have problems the morning of surgery:  [phone]   If you have any questions prior to your surgery date call [phone]: Open Monday-Friday 8am-4pm    Remember:  Do not eat or drink anything after midnight the night before your surgery     Take these medicines the morning of surgery with A SIP OF WATER:  allopurinol (ZYLOPRIM) amLODipine (NORVASC) carvedilol (COREG)  cycloSPORINE (RESTASIS) finasteride (PROSCAR)  SYNTHROID  tamsulosin (FLOMAX)    As of today, STOP taking any Aspirin (unless otherwise instructed by your surgeon) Aleve, Naproxen, Ibuprofen, Motrin, Advil, Goody's, BC's, all herbal medications, fish oil, and all vitamins.           Do not wear jewelry or makeup Do not wear lotions, powders, perfumes/colognes, or deodorant. Do not shave 48 hours prior to surgery.  Men may shave face and neck. Do not bring valuables to the hospital. Do not wear nail polish, gel polish, artificial nails, or any other type of covering on natural nails (fingers and toes) If you have artificial nails or gel coating that need to be removed by a nail salon, please have this removed prior to surgery. Artificial nails or gel coating may interfere with anesthesia's ability to adequately monitor your vital signs.  [facility] is not responsible for any belongings or valuables. .   Do NOT Smoke (Tobacco/Vaping)  24 hours prior to your procedure  If you use a CPAP at night, you may bring your mask for your overnight stay.   Contacts, glasses, hearing aids, dentures or partials may not be worn into surgery, please bring cases for these belongings   For patients admitted to the hospital, discharge time will be determined by your treatment team.   Patients discharged the day of surgery  will not be allowed to drive home, and someone needs to stay with them for 24 hours.  NO VISITORS WILL BE ALLOWED IN PRE-OP WHERE PATIENTS ARE PREPPED FOR SURGERY.  ONLY 1 SUPPORT PERSON MAY BE PRESENT IN THE WAITING ROOM WHILE YOU ARE IN SURGERY.  IF YOU ARE TO BE ADMITTED, ONCE YOU ARE IN YOUR ROOM YOU WILL BE ALLOWED TWO (2) VISITORS. 1 (ONE) VISITOR MAY STAY OVERNIGHT BUT MUST ARRIVE TO THE ROOM BY 8pm.  Minor children may have two parents present. Special consideration for safety and communication needs will be reviewed on a case by case basis.  Special instructions:    Oral Hygiene is also important to reduce your risk of infection.  Remember - BRUSH YOUR TEETH THE MORNING OF SURGERY WITH YOUR REGULAR TOOTHPASTE   [facility]- Preparing For Surgery  Before surgery, you can play an important role. Because skin is not sterile, your skin needs to be as free of germs as possible. You can reduce the number of germs on your skin by washing with CHG (chlorahexidine gluconate) Soap before surgery.  CHG is an antiseptic cleaner which kills germs and bonds with the skin to continue killing germs even after washing.     Please do not use if you have an allergy to CHG or antibacterial soaps. If your skin becomes reddened/irritated stop using the CHG.  Do not shave (including legs and underarms) for at least 48 hours prior to first CHG shower. It is OK to shave your  face.  Please follow these instructions carefully.     Shower the NIGHT BEFORE SURGERY and the MORNING OF SURGERY with CHG Soap.   If you chose to wash your hair, wash your hair first as usual with your normal shampoo. After you shampoo, rinse your hair and body thoroughly to remove the shampoo.  Then [employer] and genitals (private parts) with your normal soap and rinse thoroughly to remove soap.  After that Use CHG Soap as you would any other liquid soap. You can apply CHG directly to the skin and wash gently with a scrungie or a clean  washcloth.   Apply the CHG Soap to your body ONLY FROM THE NECK DOWN.  Do not use on open wounds or open sores. Avoid contact with your eyes, ears, mouth and genitals (private parts). Wash Face and genitals (private parts)  with your normal soap.   Wash thoroughly, paying special attention to the area where your surgery will be performed.  Thoroughly rinse your body with warm water from the neck down.  DO NOT shower/wash with your normal soap after using and rinsing off the CHG Soap.  Pat yourself dry with a CLEAN TOWEL.  Wear CLEAN PAJAMAS to bed the night before surgery  Place CLEAN SHEETS on your bed the night before your surgery  DO NOT SLEEP WITH PETS.   Day of Surgery:  Take a shower with CHG soap. Wear Clean/Comfortable clothing the morning of surgery Do not apply any deodorants/lotions.   Remember to brush your teeth WITH YOUR REGULAR TOOTHPASTE.    COVID testing  If you are going to stay overnight or be admitted after your procedure/surgery and require a pre-op COVID test, please follow these instructions after your COVID test   You are not required to quarantine however you are required to wear a well-fitting mask when you are out and around people not in your household.  If your mask becomes wet or soiled, replace with a new one.  Wash your hands often with soap and water for 20 seconds or clean your hands with an alcohol-based hand sanitizer that contains at least 60% alcohol.  Do not share personal items.  Notify your provider: if you are in close contact with someone who has COVID  or if you develop a fever of 100.4 or greater, sneezing, cough, sore throat, shortness of breath or body aches.    Please read over the following fact sheets that you were given.

## 2021-07-23 NOTE — Progress Notes (Signed)
PCP -Carola Frost Cardiologist - none  PPM/ICD - denies Device Orders -  Rep Notified -   Chest x-ray - no EKG - 07/23/21 Stress Test - none ECHO - none Cardiac Cath - none  Sleep Study - none CPAP -   Fasting Blood Sugar - doesn't check his sugar Checks Blood Sugar _____ times a day  Blood Thinner Instructions:n/a Aspirin Instructions:n/a  ERAS Protcol -no PRE-SURGERY Ensure or G2-   COVID TEST- 07/23/21   Anesthesia review: no  Patient denies shortness of breath, fever, cough and chest pain at PAT appointment   All instructions explained to the patient, with a verbal understanding of the material. Patient agrees to go over the instructions while at home for a better understanding. Patient also instructed to wear a mask while in public after being tested for COVID-19. The opportunity to ask questions was provided.

## 2021-07-24 ENCOUNTER — Ambulatory Visit
Admission: RE | Admit: 2021-07-24 | Discharge: 2021-07-24 | Disposition: A | Discharge status: Home / Self Care | Payer: Medicare Other | Source: Ambulatory Visit | Attending: Neurosurgery | Admitting: Neurosurgery

## 2021-07-24 DIAGNOSIS — M47812 Spondylosis without myelopathy or radiculopathy, cervical region: Secondary | ICD-10-CM | POA: Diagnosis not present

## 2021-07-24 DIAGNOSIS — M2578 Osteophyte, vertebrae: Secondary | ICD-10-CM | POA: Diagnosis not present

## 2021-07-24 DIAGNOSIS — M4802 Spinal stenosis, cervical region: Secondary | ICD-10-CM | POA: Diagnosis not present

## 2021-07-26 ENCOUNTER — Encounter (HOSPITAL_COMMUNITY): Payer: Self-pay | Admitting: Anesthesiology

## 2021-07-26 NOTE — Anesthesia Preprocedure Evaluation (Addendum)
Anesthesia Evaluation  Patient identified by MRN, date of birth, ID band Patient awake    Reviewed: Allergy & Precautions, NPO status , Patient's Chart, lab work & pertinent test results, reviewed documented beta blocker date and time   Airway Mallampati: I  TM Distance: >3 FB     Dental no notable dental hx. (+) Teeth Intact, Caps, Dental Advisory Given   Pulmonary neg pulmonary ROS,    Pulmonary exam normal breath sounds clear to auscultation       Cardiovascular hypertension, Pt. on medications and Pt. 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   Abdominal   Peds  Hematology ANA +   Anesthesia Other Findings   Reproductive/Obstetrics                            Anesthesia Physical Anesthesia Plan  ASA: 2  Anesthesia Plan: General   Post-op Pain Management:    Induction: Intravenous  PONV Risk Score and Plan: 3 and Treatment may vary due to age or medical condition, Ondansetron and Dexamethasone  Airway Management Planned: Oral ETT  Additional Equipment: None  Intra-op Plan:   Post-operative Plan: Extubation in OR  Informed Consent: I have reviewed the patients History and Physical, chart, labs and discussed the procedure including the risks, benefits and alternatives for the proposed anesthesia with the patient or authorized representative who has indicated his/her understanding and acceptance.     Dental advisory given  Plan  Discussed with: CRNA and Anesthesiologist  Anesthesia Plan Comments:        Anesthesia Quick Evaluation

## 2021-07-27 ENCOUNTER — Encounter (HOSPITAL_COMMUNITY): Payer: Self-pay

## 2021-07-27 ENCOUNTER — Other Ambulatory Visit: Payer: Self-pay

## 2021-07-27 ENCOUNTER — Observation Stay (HOSPITAL_COMMUNITY)
Admission: RE | Admit: 2021-07-27 | Discharge: 2021-07-28 | Disposition: A | Discharge status: Home / Self Care | Payer: Medicare Other | Source: Ambulatory Visit | Attending: Neurosurgery | Admitting: Neurosurgery

## 2021-07-27 ENCOUNTER — Ambulatory Visit (HOSPITAL_COMMUNITY): Payer: Medicare Other | Admitting: Physician Assistant

## 2021-07-27 ENCOUNTER — Ambulatory Visit (HOSPITAL_COMMUNITY): Payer: Medicare Other

## 2021-07-27 ENCOUNTER — Ambulatory Visit (HOSPITAL_COMMUNITY): Payer: Medicare Other | Admitting: Anesthesiology

## 2021-07-27 ENCOUNTER — Encounter (HOSPITAL_COMMUNITY)
Admission: RE | Disposition: A | Discharge status: Home / Self Care | Payer: Self-pay | Source: Ambulatory Visit | Attending: Neurosurgery

## 2021-07-27 DIAGNOSIS — R7303 Prediabetes: Secondary | ICD-10-CM | POA: Diagnosis not present

## 2021-07-27 DIAGNOSIS — M4316 Spondylolisthesis, lumbar region: Secondary | ICD-10-CM | POA: Diagnosis not present

## 2021-07-27 DIAGNOSIS — M48062 Spinal stenosis, lumbar region with neurogenic claudication: Secondary | ICD-10-CM | POA: Insufficient documentation

## 2021-07-27 DIAGNOSIS — M48061 Spinal stenosis, lumbar region without neurogenic claudication: Secondary | ICD-10-CM | POA: Diagnosis not present

## 2021-07-27 DIAGNOSIS — N183 Chronic kidney disease, stage 3 unspecified: Secondary | ICD-10-CM | POA: Diagnosis not present

## 2021-07-27 DIAGNOSIS — M4326 Fusion of spine, lumbar region: Secondary | ICD-10-CM | POA: Diagnosis not present

## 2021-07-27 DIAGNOSIS — M47816 Spondylosis without myelopathy or radiculopathy, lumbar region: Secondary | ICD-10-CM

## 2021-07-27 DIAGNOSIS — M532X6 Spinal instabilities, lumbar region: Secondary | ICD-10-CM | POA: Diagnosis present

## 2021-07-27 DIAGNOSIS — I129 Hypertensive chronic kidney disease with stage 1 through stage 4 chronic kidney disease, or unspecified chronic kidney disease: Secondary | ICD-10-CM | POA: Insufficient documentation

## 2021-07-27 DIAGNOSIS — Z79899 Other long term (current) drug therapy: Secondary | ICD-10-CM | POA: Insufficient documentation

## 2021-07-27 DIAGNOSIS — Z981 Arthrodesis status: Secondary | ICD-10-CM | POA: Diagnosis not present

## 2021-07-27 DIAGNOSIS — Z419 Encounter for procedure for purposes other than remedying health state, unspecified: Secondary | ICD-10-CM

## 2021-07-27 LAB — GLUCOSE, CAPILLARY
Glucose-Capillary: 143 mg/dL — ABNORMAL HIGH (ref 70–99)
Glucose-Capillary: 164 mg/dL — ABNORMAL HIGH (ref 70–99)
Glucose-Capillary: 329 mg/dL — ABNORMAL HIGH (ref 70–99)
Glucose-Capillary: 211 mg/dL — ABNORMAL HIGH (ref 70–99)

## 2021-07-27 LAB — ABO/RH: ABO/RH(D): O POS

## 2021-07-27 SURGERY — POSTERIOR LUMBAR FUSION 1 LEVEL
Anesthesia: General | Site: Spine Lumbar

## 2021-07-27 MED ORDER — CHLORHEXIDINE GLUCONATE 0.12 % MT SOLN
15.0000 mL | Freq: Once | OROMUCOSAL | Status: AC
  Administered 2021-07-27: 15 mL via OROMUCOSAL
  Filled 2021-07-27: qty 15

## 2021-07-27 MED ORDER — BUPIVACAINE LIPOSOME 1.3 % IJ SUSP
INTRAMUSCULAR | Status: AC
  Filled 2021-07-27: qty 20

## 2021-07-27 MED ORDER — INSULIN ASPART 100 UNIT/ML IJ SOLN
0.0000 [IU] | Freq: Three times a day (TID) | INTRAMUSCULAR | Status: DC
  Administered 2021-07-27: 11 [IU] via SUBCUTANEOUS
  Administered 2021-07-28: 5 [IU] via SUBCUTANEOUS

## 2021-07-27 MED ORDER — THROMBIN 5000 UNITS EX SOLR
OROMUCOSAL | Status: DC | PRN
  Administered 2021-07-27: 5 mL

## 2021-07-27 MED ORDER — VANCOMYCIN HCL 1000 MG IV SOLR
INTRAVENOUS | Status: DC | PRN
  Administered 2021-07-27: 1000 mg

## 2021-07-27 MED ORDER — CARVEDILOL 12.5 MG PO TABS
12.5000 mg | ORAL_TABLET | Freq: Two times a day (BID) | ORAL | Status: DC
  Administered 2021-07-27: 12.5 mg via ORAL
  Filled 2021-07-27: qty 1

## 2021-07-27 MED ORDER — BUPIVACAINE HCL (PF) 0.5 % IJ SOLN
INTRAMUSCULAR | Status: DC | PRN
  Administered 2021-07-27: 5 mL
  Administered 2021-07-27: 25 mL

## 2021-07-27 MED ORDER — ROCURONIUM BROMIDE 10 MG/ML (PF) SYRINGE
PREFILLED_SYRINGE | INTRAVENOUS | Status: DC | PRN
  Administered 2021-07-27: 30 mg via INTRAVENOUS
  Administered 2021-07-27: 70 mg via INTRAVENOUS
  Administered 2021-07-27: 20 mg via INTRAVENOUS

## 2021-07-27 MED ORDER — BACITRACIN ZINC 500 UNIT/GM EX OINT
TOPICAL_OINTMENT | CUTANEOUS | Status: AC
  Filled 2021-07-27: qty 28.35

## 2021-07-27 MED ORDER — VANCOMYCIN HCL 1000 MG IV SOLR
INTRAVENOUS | Status: AC
  Filled 2021-07-27: qty 20

## 2021-07-27 MED ORDER — THROMBIN 5000 UNITS EX SOLR
CUTANEOUS | Status: AC
  Filled 2021-07-27: qty 5000

## 2021-07-27 MED ORDER — FINASTERIDE 5 MG PO TABS: 5.0000 mg | ORAL_TABLET | Freq: Every day | ORAL | Status: DC

## 2021-07-27 MED ORDER — LIDOCAINE-EPINEPHRINE 1 %-1:100000 IJ SOLN
INTRAMUSCULAR | Status: AC
  Filled 2021-07-27: qty 1

## 2021-07-27 MED ORDER — EPHEDRINE SULFATE (PRESSORS) 50 MG/ML IJ SOLN
INTRAMUSCULAR | Status: DC | PRN
  Administered 2021-07-27: 10 mg via INTRAVENOUS
  Administered 2021-07-27 (×2): 5 mg via INTRAVENOUS

## 2021-07-27 MED ORDER — INSULIN ASPART 100 UNIT/ML IJ SOLN: 0.0000 [IU] | INTRAMUSCULAR | Status: DC | PRN

## 2021-07-27 MED ORDER — ONDANSETRON HCL 4 MG/2ML IJ SOLN: 4.0000 mg | Freq: Once | INTRAMUSCULAR | Status: DC | PRN

## 2021-07-27 MED ORDER — OMEGA-3-ACID ETHYL ESTERS 1 G PO CAPS
1.0000 g | ORAL_CAPSULE | Freq: Every day | ORAL | Status: DC
  Administered 2021-07-27: 1 g via ORAL
  Filled 2021-07-27: qty 1

## 2021-07-27 MED ORDER — ONDANSETRON HCL 4 MG/2ML IJ SOLN
INTRAMUSCULAR | Status: DC | PRN
  Administered 2021-07-27: 4 mg via INTRAVENOUS

## 2021-07-27 MED ORDER — ACETAMINOPHEN 650 MG RE SUPP: 650.0000 mg | RECTAL | Status: DC | PRN

## 2021-07-27 MED ORDER — OXYCODONE HCL 5 MG/5ML PO SOLN
5.0000 mg | Freq: Once | ORAL | Status: DC | PRN
Start: 2021-07-27 — End: 2021-07-27

## 2021-07-27 MED ORDER — METHOCARBAMOL 500 MG PO TABS
500.0000 mg | ORAL_TABLET | Freq: Four times a day (QID) | ORAL | Status: DC | PRN
  Administered 2021-07-27 – 2021-07-28 (×3): 500 mg via ORAL
  Filled 2021-07-27 (×2): qty 1

## 2021-07-27 MED ORDER — ALLOPURINOL 100 MG PO TABS
100.0000 mg | ORAL_TABLET | Freq: Every day | ORAL | Status: DC
  Filled 2021-07-27: qty 1

## 2021-07-27 MED ORDER — ORAL CARE MOUTH RINSE: 15.0000 mL | Freq: Once | OROMUCOSAL | Status: AC

## 2021-07-27 MED ORDER — SODIUM CHLORIDE 0.9% FLUSH: 3.0000 mL | INTRAVENOUS | Status: DC | PRN

## 2021-07-27 MED ORDER — SUGAMMADEX SODIUM 200 MG/2ML IV SOLN
INTRAVENOUS | Status: DC | PRN
  Administered 2021-07-27: 200 mg via INTRAVENOUS

## 2021-07-27 MED ORDER — PROPOFOL 10 MG/ML IV BOLUS
INTRAVENOUS | Status: AC
  Filled 2021-07-27: qty 20

## 2021-07-27 MED ORDER — PHENYLEPHRINE HCL (PRESSORS) 10 MG/ML IV SOLN
INTRAVENOUS | Status: AC
  Filled 2021-07-27: qty 1

## 2021-07-27 MED ORDER — ROCURONIUM BROMIDE 10 MG/ML (PF) SYRINGE
PREFILLED_SYRINGE | INTRAVENOUS | Status: AC
  Filled 2021-07-27: qty 10

## 2021-07-27 MED ORDER — BUPIVACAINE HCL (PF) 0.5 % IJ SOLN
INTRAMUSCULAR | Status: AC
  Filled 2021-07-27: qty 30

## 2021-07-27 MED ORDER — LIDOCAINE-EPINEPHRINE 1 %-1:100000 IJ SOLN
INTRAMUSCULAR | Status: DC | PRN
Start: 2021-07-27 — End: 2021-07-27
  Administered 2021-07-27: 5 mL

## 2021-07-27 MED ORDER — PROPOFOL 10 MG/ML IV BOLUS
INTRAVENOUS | Status: DC | PRN
  Administered 2021-07-27: 100 mg via INTRAVENOUS

## 2021-07-27 MED ORDER — LIDOCAINE 2% (20 MG/ML) 5 ML SYRINGE
INTRAMUSCULAR | Status: AC
  Filled 2021-07-27: qty 5

## 2021-07-27 MED ORDER — FLEET ENEMA 7-19 GM/118ML RE ENEM: 1.0000 | ENEMA | Freq: Once | RECTAL | Status: DC | PRN

## 2021-07-27 MED ORDER — LEVOTHYROXINE SODIUM 25 MCG PO TABS
50.0000 ug | ORAL_TABLET | Freq: Every day | ORAL | Status: DC
  Administered 2021-07-28: 50 ug via ORAL
  Filled 2021-07-27: qty 2

## 2021-07-27 MED ORDER — SODIUM CHLORIDE (PF) 0.9 % IJ SOLN
INTRAMUSCULAR | Status: DC | PRN
  Administered 2021-07-27: 10 mL

## 2021-07-27 MED ORDER — HYDROMORPHONE HCL 1 MG/ML IJ SOLN
0.5000 mg | INTRAMUSCULAR | Status: DC | PRN
  Administered 2021-07-27: 0.5 mg via INTRAVENOUS
  Filled 2021-07-27: qty 0.5

## 2021-07-27 MED ORDER — MENTHOL 3 MG MT LOZG: 1.0000 | LOZENGE | OROMUCOSAL | Status: DC | PRN

## 2021-07-27 MED ORDER — HYDROMORPHONE HCL 1 MG/ML IJ SOLN: 0.2500 mg | INTRAMUSCULAR | Status: DC | PRN

## 2021-07-27 MED ORDER — OXYCODONE HCL 5 MG PO TABS: 10.0000 mg | ORAL_TABLET | ORAL | Status: DC | PRN

## 2021-07-27 MED ORDER — POLYETHYLENE GLYCOL 3350 17 G PO PACK: 17.0000 g | PACK | Freq: Every day | ORAL | Status: DC | PRN

## 2021-07-27 MED ORDER — ALBUMIN HUMAN 5 % IV SOLN: INTRAVENOUS | Status: DC | PRN

## 2021-07-27 MED ORDER — POTASSIUM CHLORIDE IN NACL 20-0.9 MEQ/L-% IV SOLN: INTRAVENOUS | Status: DC

## 2021-07-27 MED ORDER — AMLODIPINE BESYLATE 5 MG PO TABS: 10.0000 mg | ORAL_TABLET | Freq: Every day | ORAL | Status: DC

## 2021-07-27 MED ORDER — PHENYLEPHRINE 40 MCG/ML (10ML) SYRINGE FOR IV PUSH (FOR BLOOD PRESSURE SUPPORT)
PREFILLED_SYRINGE | INTRAVENOUS | Status: AC
  Filled 2021-07-27: qty 10

## 2021-07-27 MED ORDER — ACETAMINOPHEN 325 MG PO TABS: 650.0000 mg | ORAL_TABLET | ORAL | Status: DC | PRN

## 2021-07-27 MED ORDER — 0.9 % SODIUM CHLORIDE (POUR BTL) OPTIME
TOPICAL | Status: DC | PRN
Start: 2021-07-27 — End: 2021-07-27
  Administered 2021-07-27: 1000 mL

## 2021-07-27 MED ORDER — CHLORHEXIDINE GLUCONATE CLOTH 2 % EX PADS: 6.0000 | MEDICATED_PAD | Freq: Once | CUTANEOUS | Status: DC

## 2021-07-27 MED ORDER — INSULIN ASPART 100 UNIT/ML IJ SOLN
0.0000 [IU] | Freq: Every day | INTRAMUSCULAR | Status: DC
  Administered 2021-07-27: 2 [IU] via SUBCUTANEOUS

## 2021-07-27 MED ORDER — ONDANSETRON HCL 4 MG/2ML IJ SOLN
INTRAMUSCULAR | Status: AC
  Filled 2021-07-27: qty 2

## 2021-07-27 MED ORDER — VANCOMYCIN HCL IN DEXTROSE 1-5 GM/200ML-% IV SOLN
1000.0000 mg | INTRAVENOUS | Status: DC
  Administered 2021-07-28: 1000 mg via INTRAVENOUS
  Filled 2021-07-27: qty 200

## 2021-07-27 MED ORDER — VANCOMYCIN HCL IN DEXTROSE 1-5 GM/200ML-% IV SOLN
1000.0000 mg | INTRAVENOUS | Status: AC
  Administered 2021-07-27: 1000 mg via INTRAVENOUS
  Filled 2021-07-27: qty 200

## 2021-07-27 MED ORDER — PHENOL 1.4 % MT LIQD: 1.0000 | OROMUCOSAL | Status: DC | PRN

## 2021-07-27 MED ORDER — PHENYLEPHRINE 40 MCG/ML (10ML) SYRINGE FOR IV PUSH (FOR BLOOD PRESSURE SUPPORT)
PREFILLED_SYRINGE | INTRAVENOUS | Status: DC | PRN
  Administered 2021-07-27 (×5): 80 ug via INTRAVENOUS

## 2021-07-27 MED ORDER — FENTANYL CITRATE (PF) 250 MCG/5ML IJ SOLN
INTRAMUSCULAR | Status: DC | PRN
  Administered 2021-07-27: 50 ug via INTRAVENOUS
  Administered 2021-07-27: 100 ug via INTRAVENOUS

## 2021-07-27 MED ORDER — TAMSULOSIN HCL 0.4 MG PO CAPS: 0.4000 mg | ORAL_CAPSULE | Freq: Every day | ORAL | Status: DC

## 2021-07-27 MED ORDER — PHENYLEPHRINE HCL-NACL 20-0.9 MG/250ML-% IV SOLN
INTRAVENOUS | Status: DC | PRN
  Administered 2021-07-27: 20 ug/min via INTRAVENOUS

## 2021-07-27 MED ORDER — DEXAMETHASONE SODIUM PHOSPHATE 10 MG/ML IJ SOLN
INTRAMUSCULAR | Status: DC | PRN
Start: 2021-07-27 — End: 2021-07-27
  Administered 2021-07-27: 10 mg via INTRAVENOUS

## 2021-07-27 MED ORDER — DEXAMETHASONE SODIUM PHOSPHATE 10 MG/ML IJ SOLN
INTRAMUSCULAR | Status: AC
  Filled 2021-07-27: qty 1

## 2021-07-27 MED ORDER — LISINOPRIL 20 MG PO TABS
20.0000 mg | ORAL_TABLET | Freq: Every day | ORAL | Status: DC
Start: 2021-07-27 — End: 2021-07-28
  Administered 2021-07-27: 20 mg via ORAL
  Filled 2021-07-27: qty 1

## 2021-07-27 MED ORDER — SODIUM CHLORIDE 0.9 % IV SOLN
250.0000 mL | INTRAVENOUS | Status: DC
  Administered 2021-07-27: 250 mL via INTRAVENOUS

## 2021-07-27 MED ORDER — OXYCODONE HCL 5 MG PO TABS: 5.0000 mg | ORAL_TABLET | Freq: Once | ORAL | Status: DC | PRN

## 2021-07-27 MED ORDER — OXYCODONE HCL 5 MG PO TABS
5.0000 mg | ORAL_TABLET | ORAL | Status: DC | PRN
  Administered 2021-07-27 – 2021-07-28 (×3): 5 mg via ORAL
  Filled 2021-07-27 (×3): qty 1

## 2021-07-27 MED ORDER — BUPIVACAINE LIPOSOME 1.3 % IJ SUSP
INTRAMUSCULAR | Status: DC | PRN
Start: 2021-07-27 — End: 2021-07-27
  Administered 2021-07-27: 15 mL

## 2021-07-27 MED ORDER — FENTANYL CITRATE (PF) 250 MCG/5ML IJ SOLN
INTRAMUSCULAR | Status: AC
  Filled 2021-07-27: qty 5

## 2021-07-27 MED ORDER — DOCUSATE SODIUM 100 MG PO CAPS
100.0000 mg | ORAL_CAPSULE | Freq: Two times a day (BID) | ORAL | Status: DC
  Administered 2021-07-27 (×2): 100 mg via ORAL
  Filled 2021-07-27 (×2): qty 1

## 2021-07-27 MED ORDER — SODIUM CHLORIDE 0.9% FLUSH
3.0000 mL | Freq: Two times a day (BID) | INTRAVENOUS | Status: DC
  Administered 2021-07-27 (×2): 3 mL via INTRAVENOUS

## 2021-07-27 MED ORDER — ONDANSETRON HCL 4 MG/2ML IJ SOLN: 4.0000 mg | Freq: Four times a day (QID) | INTRAMUSCULAR | Status: DC | PRN

## 2021-07-27 MED ORDER — ONDANSETRON HCL 4 MG PO TABS: 4.0000 mg | ORAL_TABLET | Freq: Four times a day (QID) | ORAL | Status: DC | PRN

## 2021-07-27 MED ORDER — LACTATED RINGERS IV SOLN: INTRAVENOUS | Status: DC

## 2021-07-27 MED ORDER — FISH OIL 1000 MG PO CAPS: 1000.0000 mg | ORAL_CAPSULE | Freq: Every day | ORAL | Status: DC

## 2021-07-27 MED ORDER — LIDOCAINE 2% (20 MG/ML) 5 ML SYRINGE
INTRAMUSCULAR | Status: DC | PRN
  Administered 2021-07-27: 60 mg via INTRAVENOUS

## 2021-07-27 MED ORDER — METHOCARBAMOL 1000 MG/10ML IJ SOLN
500.0000 mg | Freq: Four times a day (QID) | INTRAVENOUS | Status: DC | PRN
  Filled 2021-07-27: qty 5

## 2021-07-27 MED ORDER — CYCLOSPORINE 0.05 % OP EMUL
1.0000 [drp] | Freq: Every day | OPHTHALMIC | Status: DC
  Filled 2021-07-27: qty 30

## 2021-07-27 SURGICAL SUPPLY — 73 items
ADH SKN CLS APL DERMABOND .7 (GAUZE/BANDAGES/DRESSINGS) ×1
BAG COUNTER SPONGE SURGICOUNT (BAG) ×3 IMPLANT
BAG SPNG CNTER NS LX DISP (BAG) ×1
BAND INSRT 18 STRL LF DISP RB (MISCELLANEOUS)
BAND RUBBER #18 3X1/16 STRL (MISCELLANEOUS) IMPLANT
BASKET BONE COLLECTION (BASKET) ×3 IMPLANT
BLADE CLIPPER SURG (BLADE) IMPLANT
BUR MATCHSTICK NEURO 3.0 LAGG (BURR) ×3 IMPLANT
BUR PRECISION FLUTE 5.0 (BURR) ×3 IMPLANT
CANISTER SUCT 3000ML PPV (MISCELLANEOUS) ×3 IMPLANT
CNTNR URN SCR LID CUP LEK RST (MISCELLANEOUS) ×2 IMPLANT
CONT SPEC 4OZ STRL OR WHT (MISCELLANEOUS) ×2
COVER BACK TABLE 60X90IN (DRAPES) ×3 IMPLANT
DECANTER SPIKE VIAL GLASS SM (MISCELLANEOUS) ×3 IMPLANT
DERMABOND ADVANCED (GAUZE/BANDAGES/DRESSINGS) ×1
DERMABOND ADVANCED .7 DNX12 (GAUZE/BANDAGES/DRESSINGS) ×4 IMPLANT
DRAIN JACKSON PRATT 10MM FLAT (MISCELLANEOUS) ×1 IMPLANT
DRAPE 3/4 80X56 (DRAPES) ×3 IMPLANT
DRAPE C-ARM 42X72 X-RAY (DRAPES) ×3 IMPLANT
DRAPE C-ARMOR (DRAPES) ×3 IMPLANT
DRAPE LAPAROTOMY 100X72X124 (DRAPES) ×3 IMPLANT
DRAPE MICROSCOPE LEICA (MISCELLANEOUS) IMPLANT
DRSG OPSITE POSTOP 4X6 (GAUZE/BANDAGES/DRESSINGS) ×1 IMPLANT
DURAPREP 26ML APPLICATOR (WOUND CARE) ×3 IMPLANT
ELECT BLADE INSULATED 6.5IN (ELECTROSURGICAL) ×2
ELECT REM PT RETURN 9FT ADLT (ELECTROSURGICAL) ×2
ELECTRODE BLDE INSULATED 6.5IN (ELECTROSURGICAL) ×2 IMPLANT
ELECTRODE REM PT RTRN 9FT ADLT (ELECTROSURGICAL) ×2 IMPLANT
EVACUATOR SILICONE 100CC (DRAIN) ×1 IMPLANT
GAUZE 4X4 16PLY ~~LOC~~+RFID DBL (SPONGE) ×1 IMPLANT
GAUZE SPONGE 4X4 12PLY STRL (GAUZE/BANDAGES/DRESSINGS) IMPLANT
GLOVE EXAM NITRILE XL STR (GLOVE) IMPLANT
GLOVE SURG LTX SZ7.5 (GLOVE) ×6 IMPLANT
GLOVE SURG UNDER POLY LF SZ7.5 (GLOVE) ×3 IMPLANT
GOWN STRL REUS W/ TWL LRG LVL3 (GOWN DISPOSABLE) ×2 IMPLANT
GOWN STRL REUS W/ TWL XL LVL3 (GOWN DISPOSABLE) ×2 IMPLANT
GOWN STRL REUS W/TWL 2XL LVL3 (GOWN DISPOSABLE) IMPLANT
GOWN STRL REUS W/TWL LRG LVL3 (GOWN DISPOSABLE) ×2
GOWN STRL REUS W/TWL XL LVL3 (GOWN DISPOSABLE) ×2
HEMOSTAT POWDER KIT SURGIFOAM (HEMOSTASIS) ×3 IMPLANT
KIT BASIN OR (CUSTOM PROCEDURE TRAY) ×3 IMPLANT
KIT INFUSE XX SMALL 0.7CC (Orthopedic Implant) ×1 IMPLANT
KIT TURNOVER KIT B (KITS) ×3 IMPLANT
MILL MEDIUM DISP (BLADE) ×3 IMPLANT
NDL HYPO 18GX1.5 BLUNT FILL (NEEDLE) IMPLANT
NDL HYPO 21X1.5 SAFETY (NEEDLE) ×2 IMPLANT
NDL SPNL 18GX3.5 QUINCKE PK (NEEDLE) IMPLANT
NEEDLE HYPO 18GX1.5 BLUNT FILL (NEEDLE) IMPLANT
NEEDLE HYPO 21X1.5 SAFETY (NEEDLE) ×2 IMPLANT
NEEDLE HYPO 22GX1.5 SAFETY (NEEDLE) ×3 IMPLANT
NEEDLE SPNL 18GX3.5 QUINCKE PK (NEEDLE) IMPLANT
NS IRRIG 1000ML POUR BTL (IV SOLUTION) ×3 IMPLANT
PACK LAMINECTOMY NEURO (CUSTOM PROCEDURE TRAY) ×3 IMPLANT
PAD ARMBOARD 7.5X6 YLW CONV (MISCELLANEOUS) ×9 IMPLANT
PUTTY GRAFTON DBF 6CC W/DELIVE (Putty) ×1 IMPLANT
ROD 5.5X50 CP4 TITAN LUMBAR (Rod) ×2 IMPLANT
SCREW CORT HORIZON 6.5X40 (Screw) ×2 IMPLANT
SCREW MAS/SET STERILE 4PK (Screw) ×1 IMPLANT
SCREW SHANK HORIZON 5.5X35 (Screw) ×1 IMPLANT
SCREW SHANK HORIZON 5.5X40 (Screw) ×1 IMPLANT
SPACER PL CATALYFT LONG 11 (Spacer) ×2 IMPLANT
SPONGE SURGIFOAM ABS GEL 100 (HEMOSTASIS) IMPLANT
SPONGE T-LAP 4X18 ~~LOC~~+RFID (SPONGE) IMPLANT
STAPLER VISISTAT 35W (STAPLE) ×3 IMPLANT
SUT MNCRL AB 4-0 PS2 18 (SUTURE) ×3 IMPLANT
SUT VIC AB 0 CT1 18XCR BRD8 (SUTURE) ×2 IMPLANT
SUT VIC AB 0 CT1 8-18 (SUTURE) ×2
SUT VIC AB 2-0 CP2 18 (SUTURE) ×4 IMPLANT
SYR 30ML LL (SYRINGE) ×3 IMPLANT
TOWEL GREEN STERILE (TOWEL DISPOSABLE) ×3 IMPLANT
TOWEL GREEN STERILE FF (TOWEL DISPOSABLE) ×3 IMPLANT
TRAY FOLEY MTR SLVR 16FR STAT (SET/KITS/TRAYS/PACK) ×3 IMPLANT
WATER STERILE IRR 1000ML POUR (IV SOLUTION) ×3 IMPLANT

## 2021-07-27 NOTE — Anesthesia Procedure Notes (Signed)
Procedure Name: Intubation Date/Time: 07/27/2021 8:03 AM Performed by: Amadeo Garnet, CRNA Pre-anesthesia Checklist: Patient identified, Emergency Drugs available, Suction available and Patient being monitored Patient Re-evaluated:Patient Re-evaluated prior to induction Oxygen Delivery Method: Circle system utilized Preoxygenation: Pre-oxygenation with 100% oxygen Induction Type: IV induction Ventilation: Mask ventilation without difficulty Laryngoscope Size: Mac and 4 Grade View: Grade I Tube type: Oral Tube size: 7.5 mm Number of attempts: 1 Airway Equipment and Method: Stylet and Oral airway Placement Confirmation: ETT inserted through vocal cords under direct vision, positive ETCO2 and breath sounds checked- equal and bilateral Secured at: 22 cm Tube secured with: Tape Dental Injury: Teeth and Oropharynx as per pre-operative assessment

## 2021-07-27 NOTE — Progress Notes (Signed)
Pharmacy Antibiotic Note  Neil Whitaker is a 76 y.o. male admitted on 07/27/2021 with surgical prophylaxis.  Pharmacy has been consulted for vanc dosing.  Pt is s/p lumbar fusion. He does have a drain in place. We will continue with vanc for now until drain is removed.  Scr 1.88>>CrCl 36  Plan: Vanc 1g IV q24 (goal trough 10-15) F/u LOT  Height: 6' (182.9 cm) Weight: 79.4 kg (175 lb) IBW/kg (Calculated) : 77.6  Temp (24hrs), Avg:98.1 F (36.7 C), Min:97.9 F (36.6 C), Max:98.3 F (36.8 C)  Recent Labs  Lab 07/23/21 1308  WBC 13.4*  CREATININE 1.88*    Estimated Creatinine Clearance: 36.1 mL/min (A) (by C-G formula based on SCr of 1.88 mg/dL (H)).    Allergies  Allergen Reactions   Penicillins     Rash on stomach 50 years ago    Antimicrobials this admission: 2/14 vanc>>  Dose adjustments this admission:   Microbiology results:  Onnie Boer, PharmD, BCIDP, AAHIVP, CPP Infectious Disease Pharmacist 07/27/2021 2:03 PM

## 2021-07-27 NOTE — Op Note (Signed)
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  L3-4 lumbar interbody fusion via bilateral transforaminal approach, including laminectomy, bilateral facetectomies and foraminotomies 2.  L3-4 posterolateral arthrodesis 3. Placement of interbody cages L3-4 x 2 4. Nonsegmental instrumentation with cortical pedicle screw and rod construct at L3-4 5. Harvest of local autograft 6. Use of morselized allograft   SURGEON: Dr. Duffy Rhody, MD   ASSISTANT:  Dr. Consuella Lose, MD.  Please note, there were no qualified trainees available to assist with the procedure.  An assistant was required for aid in retraction of the neural elements.  2.  Weston Brass, NP  ANESTHESIA: General Endotracheal   EBL: 100 ml   IMPLANTS:  Medtronic 5.5 x 40/35 mm screws at L3, 6.5 x 40 mm screws at L4 11-16 x 83mm Catalyft expandable interbody implant x 2 50 mm rods  SPECIMENS: None   DRAINS: None   COMPLICATIONS: None   CONDITION: Stable to PACU   HISTORY: This is a 76 yo M who initially presented to the outpatient clinic with severe mechanical back pain as well as neurogenic claudication.  Imaging showed L3-4 severe stenosis with instability mainly from severe bilateral facet arthropathy.  Treatment options were discussed and the patient elected to proceed with posterior lumbar interbody fusion at L3-4. risks, benefits, alternatives, and expected convalescence were discussed with the patient.  Risks discussed included but were not limited to bleeding, pain, infection, scar, pseudoarthrosis, adjacent segment disease, CSF leak, neurologic deficit, paralysis, and death.  The patient wished to proceed with surgery and informed consent was obtained.   PROCEDURE IN DETAIL: After informed consent was obtained and witnessed, the patient was brought to the operating room. After induction of general anesthesia, the  patient was positioned on the operative table in the prone position on a Wilson frame in neutral position with all pressure points meticulously padded. The skin of the low back was then prepped and draped in the usual sterile fashion.  1% lidocaine with epinephrine was injected in the skin and a timeout was performed.  Preoperative antibiotics were administered.  Incision was made with a 10 blade over the L3-4 interspace as confirmed on C-arm.  Subcutaneous tissue and the fascia was incised with monopolar electrocautery.  The paraspinous muscles were dissected off the lamina in subperiosteal fashion.  Additional dissection continued out laterally, exposing the transverse processes of L4 bilaterally which were then decorticated.  C-arm x-ray confirmed appropriate levels.  Using AP C arm x-ray, pilot holes were drilled for cortical screws at L3 and L4  bilaterally.  The C-arm was then switched to lateral x-ray and the pedicles were cannulated with the high-speed drill followed by a awl tip tap.  Ball ended feeler confirmed good bony channels circumferentially and at the depth.  Henriette Combs were placed in the L3 pedicles.  The interspinous ligament at L3-4 was then removed and laminectomy of the bottom two thirds of the lamina as well as the superior third of the inferior  lamina was performed with high-speed drill.  Facetectomies were then performed as well bilaterally, with bone harvested for autograft.  There was a fair amount of epidural scarring which was quite stuck to the facets and required careful dissection.  The bilateral foramina were opened and the disc space was exposed bilaterally.  Discectomy was then performed bilaterally with 15 blade, rongeurs, and various ringed and angled curettes as well as disc shavers using C arm for  guidance.  The endplates were prepared with rasps.  Trials were placed and appropriate interbody implants were selected.  The interbody space was then packed with extra extra small BMP  as well as autograft and allograft.  Under C-arm guidance, the interbody implants were tamped into place and then expanded under lateral x-ray guidance until snug.  Good reduction of spondylolisthesis and restoration of foraminal height was noted.  The cages were backfilled and detached.  Using C-arm x-ray, screws were then placed in the previously tapped cortical pedicle screw holes.  Meticulous hemostasis was obtained.  Good decompression was confirmed with easy passage of ball ended nerve hook.  Henriette Combs were then placed under x-ray guidance in the L4 pedicles.  Tulip heads were then placed on the screws and rods were placed bilaterally and secured with screw caps and final tightened.  Final AP and lateral C-arm x-rays showed good placement of the implants.  Decortication was performed and remaining autograft and allograft was placed in the lateral gutters between the transverse processes bilaterally.  A medium JP drain was placed in the subfascial space and tunneled out the skin and secured with a stitch.   Wound was irrigated thoroughly.  Exparel mixed with Marcaine was injected into the paraspinous muscles and subcutaneous tissues bilaterally.  The fascia was closed with 0 Vicryl stitches.  The dermal layer was closed with 2-0 Vicryl stitches in buried interrupted fashion.  The skin incisions were closed with 4-0 Monocryl subcuticular manner followed by Dermabond.  Sterile dressings were placed.  Patient was then flipped supine and extubated by the anesthesia service following commands and all 4 extremities.  All counts were correct at the end of surgery.  No complications were noted.

## 2021-07-27 NOTE — Anesthesia Postprocedure Evaluation (Signed)
Anesthesia Post Note  Patient: Neil Whitaker  Procedure(s) Performed: Posterior Lumbar Interbody Fusion Lumbar Three-Lumbar Four (Spine Lumbar)     Patient location during evaluation: PACU Anesthesia Type: General Level of consciousness: awake and alert and oriented Pain management: pain level controlled Vital Signs Assessment: post-procedure vital signs reviewed and stable Respiratory status: spontaneous breathing, nonlabored ventilation and respiratory function stable Cardiovascular status: blood pressure returned to baseline and stable Postop Assessment: no apparent nausea or vomiting Anesthetic complications: no   No notable events documented.  Last Vitals:  Vitals:   07/27/21 1245 07/27/21 1300  BP: 117/64 116/65  Pulse: 73 72  Resp: 11 12  Temp:    SpO2: 95% 92%    Last Pain:  Vitals:   07/27/21 1300  TempSrc:   PainSc: 0-No pain                 Devonia Farro A.

## 2021-07-27 NOTE — Transfer of Care (Signed)
Immediate Anesthesia Transfer of Care Note  Patient: Floyed Masoud  Procedure(s) Performed: Posterior Lumbar Interbody Fusion Lumbar Three-Lumbar Four (Spine Lumbar)  Patient Location: PACU  Anesthesia Type:General  Level of Consciousness: drowsy  Airway & Oxygen Therapy: Patient Spontanous Breathing  Post-op Assessment: Report given to RN, Post -op Vital signs reviewed and stable and Patient moving all extremities  Post vital signs: Reviewed and stable  Last Vitals:  Vitals Value Taken Time  BP    Temp    Pulse    Resp    SpO2      Last Pain:  Vitals:   07/27/21 0615  TempSrc:   PainSc: 0-No pain         Complications: No notable events documented.

## 2021-07-27 NOTE — Progress Notes (Signed)
Orthopedic Tech Progress Note Patient Details:  Neil Whitaker Oct 18, 1945 [id number]  Ortho Devices Type of Ortho Device: Lumbar corsett Ortho Device/Splint Location: BACK Ortho Device/Splint Interventions: Ordered   Post Interventions Patient Tolerated: Well Instructions Provided: Care of device  Janit Pagan 07/27/2021, 1:56 PM

## 2021-07-27 NOTE — H&P (Signed)
CC: back and bilateral leg pain.  HPI:     Patient is a 76 y.o. male presents with neurogenic claudication, spondylolisthesis.  He is here for elective PLIF    There are no problems to display for this patient.  Past Medical History:  Diagnosis Date   ANA positive    Benign hypertension with CKD (chronic kidney disease) stage III ([place]) 06/13/1964   BPH (benign prostatic hyperplasia)    CKD (chronic kidney disease), stage III ([facility])    CKD (chronic kidney disease), stage III ([facility])    Degenerative cervical disc    WITH OCCASIONAL NERVE ROOT IRRITATION   Elevated blood uric acid level    Gout of big toe    H/O prostatitis    Hearing loss    BILATERAL HEARING AID   History of elevated PSA    History of kidney stones    Hypercholesterolemia    Hypertension    Low back pain    Pre-diabetes    Subclinical hypothyroidism    Synovitis of wrist     Past Surgical History:  Procedure Laterality Date   APPENDECTOMY     EYE SURGERY Bilateral    cataract   HEMORRHOIDECTOMY WITH HEMORRHOID BANDING      Medications Prior to Admission  Medication Sig Dispense Refill Last Dose   allopurinol (ZYLOPRIM) 100 MG tablet Take 100 mg by mouth daily.   07/27/2021 at 0400   amLODipine (NORVASC) 10 MG tablet Take 10 mg by mouth daily.   07/27/2021 at 040   carvedilol (COREG) 12.5 MG tablet Take 1 tablet (12.5 mg total) by mouth 2 (two) times daily. 180 tablet 3 07/27/2021 at 0400   cycloSPORINE (RESTASIS) 0.05 % ophthalmic emulsion Place 1 drop into both eyes daily.   07/27/2021 at 0400   finasteride (PROSCAR) 5 MG tablet Take 5 mg by mouth daily.   07/27/2021 at 040   lisinopril (PRINIVIL,ZESTRIL) 20 MG tablet Take 20 mg by mouth daily.   07/26/2021   Omega-3 Fatty Acids (FISH OIL) 1000 MG CAPS Take 1,000 mg by mouth daily.   Past Week   SYNTHROID 50 MCG tablet Take 50 mcg by mouth daily.   07/27/2021 at 0400   tamsulosin (FLOMAX) 0.4 MG CAPS capsule Take 0.4 mg by mouth daily.   07/27/2021 at 0400    triamcinolone cream (KENALOG) 0.1 % Apply 1 application topically daily as needed (skin irritation).   Past Week   Allergies  Allergen Reactions   Penicillins     Rash on stomach 50 years ago    Social History   Tobacco Use   Smoking status: Never   Smokeless tobacco: Never  Substance Use Topics   Alcohol use: Yes    Comment: RARE    Family History  Problem Relation Age of Onset   Cancer Mother        KIDNEY   Gout Father    Lupus Sister    CVA Paternal Grandmother    Heart attack Paternal Grandfather      Review of Systems Pertinent items are noted in HPI.  Objective:   Patient Vitals for the past 8 hrs:  BP Temp Temp src Pulse Resp SpO2 Height Weight  07/27/21 0546 (!) 150/75 98 F (36.7 C) Oral 71 17 98 % 6' (1.829 m) 79.4 kg   No intake/output data recorded. No intake/output data recorded.      General : Alert, cooperative, no distress, appears stated age   Head:  Normocephalic/atraumatic  Eyes: PERRL, conjunctiva/corneas clear, EOM's intact. Fundi could not be visualized Neck: Supple Chest:  Respirations unlabored Chest wall: no tenderness or deformity Heart: Regular rate and rhythm Abdomen: Soft, nontender and nondistended Extremities: warm and well-perfused Skin: normal turgor, color and texture Neurologic:  Alert, oriented x 3.  Eyes open spontaneously. PERRL, EOMI, VFC, no facial droop. V1-3 intact.  No dysarthria, tongue protrusion symmetric.  CNII-XII intact. Normal strength, sensation and reflexes throughout.  No pronator drift, full strength in legs.  + SLR bilaterally       Data ReviewCBC:  Lab Results  Component Value Date   WBC 13.4 (H) 07/23/2021   RBC 4.04 (L) 07/23/2021   BMP:  Lab Results  Component Value Date   GLUCOSE 125 (H) 07/23/2021   CO2 22 07/23/2021   BUN 37 (H) 07/23/2021   CREATININE 1.88 (H) 07/23/2021   CALCIUM 10.1 07/23/2021   Radiology review:  See clinic note  Assessment:   Active Problems:   * No  active hospital problems. * L3-4 stenosis with instability  Plan:   - L3-4 PLIF today

## 2021-07-28 DIAGNOSIS — R7303 Prediabetes: Secondary | ICD-10-CM | POA: Diagnosis not present

## 2021-07-28 DIAGNOSIS — I129 Hypertensive chronic kidney disease with stage 1 through stage 4 chronic kidney disease, or unspecified chronic kidney disease: Secondary | ICD-10-CM | POA: Diagnosis not present

## 2021-07-28 DIAGNOSIS — Z79899 Other long term (current) drug therapy: Secondary | ICD-10-CM | POA: Diagnosis not present

## 2021-07-28 DIAGNOSIS — M48062 Spinal stenosis, lumbar region with neurogenic claudication: Secondary | ICD-10-CM | POA: Diagnosis not present

## 2021-07-28 DIAGNOSIS — M4316 Spondylolisthesis, lumbar region: Secondary | ICD-10-CM | POA: Diagnosis not present

## 2021-07-28 DIAGNOSIS — N183 Chronic kidney disease, stage 3 unspecified: Secondary | ICD-10-CM | POA: Diagnosis not present

## 2021-07-28 LAB — GLUCOSE, CAPILLARY: Glucose-Capillary: 210 mg/dL — ABNORMAL HIGH (ref 70–99)

## 2021-07-28 MED ORDER — OXYCODONE-ACETAMINOPHEN 5-325 MG PO TABS: 1.0000 | ORAL_TABLET | ORAL | 0 refills | Status: DC | PRN

## 2021-07-28 MED ORDER — METHOCARBAMOL 500 MG PO TABS: 500.0000 mg | ORAL_TABLET | Freq: Four times a day (QID) | ORAL | 2 refills | Status: AC

## 2021-07-28 NOTE — Evaluation (Signed)
Physical Therapy Evaluation & Discharge  Patient Details Name: Neil Whitaker MRN: [id number] DOB: February 22, 1946 Today's Date: 07/28/2021  History of Present Illness  76 y/o male admitted on 07/27/21 following L3-4 PLIF. PMH: CKD stage III, HTN, pre-diabetes  Clinical Impression  Patient admitted following procedure. Patient functioning at modI level for mobility with no AD. Patient and wife educated on back precautions, brace wear, and progressive walking program, patient and wife verbalized understanding. Patient will have wife to assist as needed at discharge. No further skilled PT needs identified acutely. No PT follow up recommended at this time. Patient will benefit from OPPT once back precautions are lifted to work on balance deficits.        Recommendations for follow up therapy are one component of a multi-disciplinary discharge planning process, led by the attending physician.  Recommendations may be updated based on patient status, additional functional criteria and insurance authorization.  Follow Up Recommendations No PT follow up    Assistance Recommended at Discharge Intermittent Supervision/Assistance  Patient can return home with the following       Equipment Recommendations None recommended by PT  Recommendations for Other Services       Functional Status Assessment Patient has not had a recent decline in their functional status     Precautions / Restrictions Precautions Precautions: Back;Fall Precaution Booklet Issued: Yes (comment) Required Braces or Orthoses: Spinal Brace Spinal Brace: Lumbar corset;Applied in standing position Restrictions Weight Bearing Restrictions: No      Mobility  Bed Mobility Overal bed mobility: Modified Independent             General bed mobility comments: increased time and use of bed rail s    Transfers Overall transfer level: Modified independent Equipment used: None               General transfer comment:  increased time    Ambulation/Gait Ambulation/Gait assistance: Modified independent (Device/Increase time) Gait Distance (Feet): 400 Feet Assistive device: None Gait Pattern/deviations: Drifts right/left Gait velocity: decreased     General Gait Details: mild balance deficits noted with mobility  Stairs Stairs: Yes Stairs assistance: Modified independent (Device/Increase time) Stair Management: One rail Left, Alternating pattern, Forwards Number of Stairs: 7    Wheelchair Mobility    Modified Rankin (Stroke Patients Only)       Balance Overall balance assessment: Mild deficits observed, not formally tested                                           Pertinent Vitals/Pain Pain Assessment Pain Assessment: Faces Faces Pain Scale: Hurts little more Pain Location: back Pain Descriptors / Indicators: Grimacing Pain Intervention(s): Monitored during session    Home Living Family/patient expects to be discharged to:: Private residence Living Arrangements: Spouse/significant other Available Help at Discharge: Family;Available 24 hours/day Type of Home: House Home Access: Stairs to enter Entrance Stairs-Rails: None Entrance Stairs-Number of Steps: 3 Alternate Level Stairs-Number of Steps: 6 Home Layout: Multi-level;Bed/bath upstairs Home Equipment: Conservation officer, nature (2 wheels);Cane - single point;Shower seat      Prior Function Prior Level of Function : Independent/Modified Independent;History of Falls (last six months);Driving             Mobility Comments: reports fall in 05/2021       Hand Dominance        Extremity/Trunk Assessment   Upper Extremity Assessment  Upper Extremity Assessment: Defer to OT evaluation    Lower Extremity Assessment Lower Extremity Assessment: Generalized weakness;RLE deficits/detail;LLE deficits/detail RLE Sensation: history of peripheral neuropathy LLE Sensation: history of peripheral neuropathy     Cervical / Trunk Assessment Cervical / Trunk Assessment: Back Surgery  Communication   Communication: No difficulties  Cognition Arousal/Alertness: Awake/alert Behavior During Therapy: WFL for tasks assessed/performed Overall Cognitive Status: Within Functional Limits for tasks assessed                                          General Comments      Exercises     Assessment/Plan    PT Assessment Patient does not need any further PT services  PT Problem List         PT Treatment Interventions      PT Goals (Current goals can be found in the Care Plan section)  Acute Rehab PT Goals Patient Stated Goal: to go home PT Goal Formulation: All assessment and education complete, DC therapy    Frequency       Co-evaluation               AM-PAC PT "6 Clicks" Mobility  Outcome Measure Help needed turning from your back to your side while in a flat bed without using bedrails?: None Help needed moving from lying on your back to sitting on the side of a flat bed without using bedrails?: None Help needed moving to and from a bed to a chair (including a wheelchair)?: None Help needed standing up from a chair using your arms (e.g., wheelchair or bedside chair)?: None Help needed to walk in hospital room?: None Help needed climbing 3-5 steps with a railing? : None 6 Click Score: 24    End of Session Equipment Utilized During Treatment: Back brace Activity Tolerance: Patient tolerated treatment well Patient left: in bed;with call bell/phone within reach;with family/visitor present Nurse Communication: Mobility status PT Visit Diagnosis: Unsteadiness on feet (R26.81);Muscle weakness (generalized) (M62.81)    Time: 8527-7824 PT Time Calculation (min) (ACUTE ONLY): 29 min   Charges:   PT Evaluation $PT Eval Moderate Complexity: 1 Mod PT Treatments $Gait Training: 8-22 mins        Kairo Laubacher A. Gilford Rile PT, DPT Acute Rehabilitation Services Pager  [phone] Office [phone]   Linna Hoff 07/28/2021, 10:03 AM

## 2021-07-28 NOTE — Discharge Summary (Signed)
Physician Discharge Summary  Patient ID: Neil Whitaker MRN: [id number] DOB/AGE: 20-Sep-1945 76 y.o.  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 The patient tolerated the procedure well and was taken to the recovery room and then to the floor in stable condition. The hospital course was routine. There were no complications. The wound remained clean dry and intact. Pt had appropriate back soreness. No complaints of leg pain or new N/T/W. The patient remained afebrile with stable vital signs, and tolerated a regular diet. The patient continued to increase activities, and pain was well controlled with oral pain medications.   Consults: None  Significant Diagnostic Studies: radiology: X-Ray: intraoperative   Treatments: surgery:  1.  L3-4 lumbar interbody fusion via bilateral transforaminal approach, including laminectomy, bilateral facetectomies and foraminotomies 2.  L3-4 posterolateral arthrodesis 3. Placement of interbody cages L3-4 x 2 4. Nonsegmental instrumentation with cortical pedicle screw and rod construct at L3-4 5. Harvest of local autograft 6. Use of morselized allograft  Discharge Exam: Blood pressure 139/73, pulse 95, temperature (!) 97.5 F (36.4 C), temperature source Oral, resp. rate 16, height 6' (1.829 m), weight 79.4 kg, SpO2 96 %.  Physical Exam: Patient is awake, A/O X 4, conversant, and in good spirits. They are in NAD and VSS. Doing well. Speech is fluent and appropriate. MAEW. 5/5 BUE/BLE except for bilateral EHL 4+/5. Sensation to light touch is intact. PERLA, EOMI. CNs grossly intact. Dressing is clean dry intact. Incision  is well approximated with no drainage, erythema, or edema.    Disposition: Discharge disposition: 01-Home or Self Care       Discharge Instructions     Incentive spirometry RT   Complete by: As directed       Allergies as of 07/28/2021       Reactions   Penicillins    Rash on stomach 50 years ago        Medication List     TAKE these medications    allopurinol 100 MG tablet Commonly known as: ZYLOPRIM Take 100 mg by mouth daily.   amLODipine 10 MG tablet Commonly known as: NORVASC Take 10 mg by mouth daily.   carvedilol 12.5 MG tablet Commonly known as: COREG Take 1 tablet (12.5 mg total) by mouth 2 (two) times daily.   cycloSPORINE 0.05 % ophthalmic emulsion Commonly known as: RESTASIS Place 1 drop into both eyes daily.   finasteride 5 MG tablet Commonly known as: PROSCAR Take 5 mg by mouth daily.   Fish Oil 1000 MG Caps Take 1,000 mg by mouth daily.   lisinopril 20 MG tablet Commonly known as: ZESTRIL Take 20 mg by mouth daily.   methocarbamol 500 MG tablet Commonly known as: Robaxin Take 1 tablet (500 mg total) by mouth 4 (four) times daily.   oxyCODONE-acetaminophen 5-325 MG tablet Commonly known as: Percocet Take 1-2 tablets by mouth every 4 (four) hours as needed for severe pain.   Synthroid 50 MCG tablet Generic drug: levothyroxine Take 50 mcg by mouth daily.   tamsulosin 0.4 MG Caps capsule Commonly known as: FLOMAX Take 0.4 mg by mouth daily.   triamcinolone cream 0.1 % Commonly known as: KENALOG Apply 1 application topically daily as needed (skin  irritation).         Signed: Marvis Moeller, DNP, NP-C 07/28/2021, 9:03 AM

## 2021-07-28 NOTE — Progress Notes (Signed)
Patient alert and oriented, mae's well, voiding adequate amount of urine, swallowing without difficulty, no c/o pain at time of discharge. Patient discharged home with family. Script and discharged instructions given to patient. Patient and family stated understanding of instructions given. Patient has an appointment with Dr. Thomas ?

## 2021-07-28 NOTE — Evaluation (Signed)
Occupational Therapy Evaluation Patient Details Name: Neil Whitaker MRN: [id number] DOB: July 30, 1945 Today's Date: 07/28/2021   History of Present Illness 76 y/o male admitted on 07/27/21 following L3-4 PLIF. PMH: CKD stage III, HTN, pre-diabetes   Clinical Impression   Pt admitted for concerns listed above. PTA pt reported that he was independent with all ADL's and IADL's, using no dme. At this time, pt continues to demonstrate independence with BADL's, following compensatory strategies. Pt is mildly limited by pain, however functionally requiring no assist. Reviewed spinal precautions and compensatory strategies for ADL's and IADL's. Pt has no further OT needs and acute OT Will sign off.      Recommendations for follow up therapy are one component of a multi-disciplinary discharge planning process, led by the attending physician.  Recommendations may be updated based on patient status, additional functional criteria and insurance authorization.   Follow Up Recommendations  No OT follow up    Assistance Recommended at Discharge PRN  Patient can return home with the following A little help with bathing/dressing/bathroom    Functional Status Assessment  Patient has had a recent decline in their functional status and demonstrates the ability to make significant improvements in function in a reasonable and predictable amount of time.  Equipment Recommendations  None recommended by OT    Recommendations for Other Services       Precautions / Restrictions Precautions Precautions: Back;Fall Precaution Booklet Issued: Yes (comment) Required Braces or Orthoses: Spinal Brace Spinal Brace: Lumbar corset;Applied in standing position Restrictions Weight Bearing Restrictions: No      Mobility Bed Mobility Overal bed mobility: Modified Independent             General bed mobility comments: increased time and use of bed rail s    Transfers Overall transfer level: Modified  independent Equipment used: None               General transfer comment: increased time      Balance Overall balance assessment: Mild deficits observed, not formally tested                                         ADL either performed or assessed with clinical judgement   ADL Overall ADL's : Modified independent                                       General ADL Comments: Pt able to complete all ADL's with no assist at this time, following compensatory strategies.     Vision Baseline Vision/History: 1 Wears glasses Ability to See in Adequate Light: 0 Adequate Patient Visual Report: No change from baseline Vision Assessment?: No apparent visual deficits     Perception     Praxis      Pertinent Vitals/Pain Pain Assessment Pain Assessment: 0-10 Pain Score: 3  Pain Location: back Pain Descriptors / Indicators: Grimacing Pain Intervention(s): Monitored during session, Repositioned     Hand Dominance Right   Extremity/Trunk Assessment Upper Extremity Assessment Upper Extremity Assessment: Overall WFL for tasks assessed   Lower Extremity Assessment Lower Extremity Assessment: Defer to PT evaluation RLE Sensation: history of peripheral neuropathy LLE Sensation: history of peripheral neuropathy   Cervical / Trunk Assessment Cervical / Trunk Assessment: Back Surgery   Communication Communication Communication: No difficulties  Cognition Arousal/Alertness: Awake/alert Behavior During Therapy: WFL for tasks assessed/performed Overall Cognitive Status: Within Functional Limits for tasks assessed                                       General Comments  VSS on RA, honeycomb dressing intact with drainage noted throughout dressing, RN aware.    Exercises     Shoulder Instructions      Home Living Family/patient expects to be discharged to:: Private residence Living Arrangements: Spouse/significant  other Available Help at Discharge: Family;Available 24 hours/day Type of Home: House Home Access: Stairs to enter [employer] of Steps: 3 Entrance Stairs-Rails: None Home Layout: Multi-level;Bed/bath upstairs Alternate Level Stairs-Number of Steps: 6 Alternate Level Stairs-Rails: Left Bathroom Shower/Tub: Hospital doctor Toilet: Handicapped height     Home Equipment: Conservation officer, nature (2 wheels);Cane - single point;Shower seat          Prior Functioning/Environment Prior Level of Function : Independent/Modified Independent;History of Falls (last six months);Driving             Mobility Comments: reports fall in 05/2021          OT Problem List: Decreased strength;Decreased activity tolerance;Impaired balance (sitting and/or standing);Pain      OT Treatment/Interventions:      OT Goals(Current goals can be found in the care plan section) Acute Rehab OT Goals Patient Stated Goal: To go home OT Goal Formulation: With patient Time For Goal Achievement: 07/28/21 Potential to Achieve Goals: Good  OT Frequency:      Co-evaluation              AM-PAC OT "6 Clicks" Daily Activity     Outcome Measure Help from another person eating meals?: None Help from another person taking care of personal grooming?: None Help from another person toileting, which includes using toliet, bedpan, or urinal?: None Help from another person bathing (including washing, rinsing, drying)?: None Help from another person to put on and taking off regular upper body clothing?: None Help from another person to put on and taking off regular lower body clothing?: None 6 Click Score: 24   End of Session Equipment Utilized During Treatment: Back brace Nurse Communication: Mobility status  Activity Tolerance: Patient tolerated treatment well Patient left: in bed;with call bell/phone within reach;with family/visitor present  OT Visit Diagnosis: Unsteadiness on feet  (R26.81);Other abnormalities of gait and mobility (R26.89);Muscle weakness (generalized) (M62.81)                Time: 8250-5397 OT Time Calculation (min): 17 min Charges:  OT Evaluation $OT Eval Moderate Complexity: 1 Mod  Joclynn Lumb H., OTR/L Acute Rehabilitation  Marquies Wanat Elane Yolanda Bonine 07/28/2021, 10:44 AM

## 2021-07-28 NOTE — Care Management Obs Status (Signed)
[place] NOTIFICATION   Patient Details  Name: Webster Patrone MRN: [id number] Date of Birth: Jan 10, 1946   Medicare Observation Status Notification Given:  Yes    Carles Collet, RN 07/28/2021, 9:32 AM

## 2021-07-28 NOTE — Care Management CC44 (Signed)
Condition Code 44 Documentation Completed  Patient Details  Name: Neil Whitaker MRN: [id number] Date of Birth: 02/08/1946   Condition Code 44 given:  Yes Patient signature on Condition Code 44 notice:  Yes Documentation of 2 MD's agreement:  Yes Code 44 added to claim:  Yes    Carles Collet, RN 07/28/2021, 9:33 AM

## 2021-07-28 NOTE — Discharge Instructions (Addendum)
Wound Care Remove outer dressing in 3 days Leave incision open to air. You may shower. Do not scrub directly on incision.  Do not put any creams, lotions, or ointments on incision. Activity Walk each and every day, increasing distance each day. No lifting greater than 5 lbs.  Avoid bending, arching, and twisting. No driving for 2 weeks; may ride as a passenger locally. If provided with back brace, wear when out of bed.  It is not necessary to wear in bed. Diet Resume your normal diet.   Call Your Doctor If Any of These Occur Redness, drainage, or swelling at the wound.  Temperature greater than 101 degrees. Severe pain not relieved by pain medication. Incision starts to come apart. Follow Up Appt Call today for appointment in 3 weeks ([phone]) or for problems.  If you have any hardware placed in your spine, you will need an x-ray before your appointment.

## 2021-08-02 ENCOUNTER — Encounter (HOSPITAL_COMMUNITY): Payer: Self-pay | Admitting: Neurosurgery

## 2021-08-27 DIAGNOSIS — R350 Frequency of micturition: Secondary | ICD-10-CM | POA: Diagnosis not present

## 2021-09-16 DIAGNOSIS — R300 Dysuria: Secondary | ICD-10-CM | POA: Diagnosis not present

## 2021-09-16 DIAGNOSIS — N281 Cyst of kidney, acquired: Secondary | ICD-10-CM | POA: Diagnosis not present

## 2021-09-16 DIAGNOSIS — R350 Frequency of micturition: Secondary | ICD-10-CM | POA: Diagnosis not present

## 2021-09-16 DIAGNOSIS — N401 Enlarged prostate with lower urinary tract symptoms: Secondary | ICD-10-CM | POA: Diagnosis not present

## 2021-09-16 DIAGNOSIS — N200 Calculus of kidney: Secondary | ICD-10-CM | POA: Diagnosis not present

## 2021-09-28 ENCOUNTER — Other Ambulatory Visit: Payer: Self-pay | Admitting: Urology

## 2021-09-29 DIAGNOSIS — M48061 Spinal stenosis, lumbar region without neurogenic claudication: Secondary | ICD-10-CM | POA: Diagnosis not present

## 2021-09-29 DIAGNOSIS — M48062 Spinal stenosis, lumbar region with neurogenic claudication: Secondary | ICD-10-CM | POA: Diagnosis not present

## 2021-09-29 DIAGNOSIS — R202 Paresthesia of skin: Secondary | ICD-10-CM | POA: Diagnosis not present

## 2021-09-29 DIAGNOSIS — M4802 Spinal stenosis, cervical region: Secondary | ICD-10-CM | POA: Diagnosis not present

## 2021-10-06 DIAGNOSIS — R200 Anesthesia of skin: Secondary | ICD-10-CM | POA: Diagnosis not present

## 2021-10-20 DIAGNOSIS — G5601 Carpal tunnel syndrome, right upper limb: Secondary | ICD-10-CM | POA: Diagnosis not present

## 2021-10-25 DIAGNOSIS — M1009 Idiopathic gout, multiple sites: Secondary | ICD-10-CM | POA: Diagnosis not present

## 2021-10-25 DIAGNOSIS — G5602 Carpal tunnel syndrome, left upper limb: Secondary | ICD-10-CM | POA: Diagnosis not present

## 2021-10-25 DIAGNOSIS — Z6822 Body mass index (BMI) 22.0-22.9, adult: Secondary | ICD-10-CM | POA: Diagnosis not present

## 2021-10-25 DIAGNOSIS — R768 Other specified abnormal immunological findings in serum: Secondary | ICD-10-CM | POA: Diagnosis not present

## 2021-10-25 DIAGNOSIS — M1991 Primary osteoarthritis, unspecified site: Secondary | ICD-10-CM | POA: Diagnosis not present

## 2021-10-25 DIAGNOSIS — Z79899 Other long term (current) drug therapy: Secondary | ICD-10-CM | POA: Diagnosis not present

## 2021-10-25 DIAGNOSIS — M5136 Other intervertebral disc degeneration, lumbar region: Secondary | ICD-10-CM | POA: Diagnosis not present

## 2021-10-25 DIAGNOSIS — G629 Polyneuropathy, unspecified: Secondary | ICD-10-CM | POA: Diagnosis not present

## 2021-11-01 DIAGNOSIS — K760 Fatty (change of) liver, not elsewhere classified: Secondary | ICD-10-CM | POA: Diagnosis not present

## 2021-11-01 DIAGNOSIS — I700 Atherosclerosis of aorta: Secondary | ICD-10-CM | POA: Diagnosis not present

## 2021-11-01 DIAGNOSIS — E039 Hypothyroidism, unspecified: Secondary | ICD-10-CM | POA: Diagnosis not present

## 2021-11-01 DIAGNOSIS — G5601 Carpal tunnel syndrome, right upper limb: Secondary | ICD-10-CM | POA: Diagnosis not present

## 2021-11-01 DIAGNOSIS — D649 Anemia, unspecified: Secondary | ICD-10-CM | POA: Diagnosis not present

## 2021-11-01 DIAGNOSIS — Z0000 Encounter for general adult medical examination without abnormal findings: Secondary | ICD-10-CM | POA: Diagnosis not present

## 2021-11-01 DIAGNOSIS — N400 Enlarged prostate without lower urinary tract symptoms: Secondary | ICD-10-CM | POA: Diagnosis not present

## 2021-11-01 DIAGNOSIS — Z1331 Encounter for screening for depression: Secondary | ICD-10-CM | POA: Diagnosis not present

## 2021-11-01 DIAGNOSIS — E875 Hyperkalemia: Secondary | ICD-10-CM | POA: Diagnosis not present

## 2021-11-01 DIAGNOSIS — M109 Gout, unspecified: Secondary | ICD-10-CM | POA: Diagnosis not present

## 2021-11-01 DIAGNOSIS — E782 Mixed hyperlipidemia: Secondary | ICD-10-CM | POA: Diagnosis not present

## 2021-11-01 DIAGNOSIS — I10 Essential (primary) hypertension: Secondary | ICD-10-CM | POA: Diagnosis not present

## 2021-11-01 DIAGNOSIS — N1831 Chronic kidney disease, stage 3a: Secondary | ICD-10-CM | POA: Diagnosis not present

## 2021-11-11 DIAGNOSIS — G5601 Carpal tunnel syndrome, right upper limb: Secondary | ICD-10-CM | POA: Diagnosis not present

## 2021-12-09 NOTE — Progress Notes (Addendum)
Anesthesia Review:  PCP: Antony Contras  Cardiologist : none  Chest x-ray : EKG :07/23/21  Echo : Stress test: Cardiac Cath :  Activity level: can do a flight of stairs without difficutly  Sleep Study/ CPAP : none  Fasting Blood Sugar :      / Checks Blood Sugar -- times a day:   Blood Thinner/ Instructions /Last Dose: ASA / Instructions/ Last Dose :   07/27/21- back surgery with DR Charlett Blake  Prediabetes- does not check glucose at home on no meds  Hgba1c-12/21/21- 6.9  BMP done 12/21/21 routed to DR Abner Greenspan.

## 2021-12-20 NOTE — Progress Notes (Signed)
DUE TO COVID-19 ONLY ONE VISITOR IS ALLOWED TO COME WITH YOU AND STAY IN THE WAITING ROOM ONLY DURING PRE OP AND PROCEDURE DAY OF SURGERY.  2 VISITOR  MAY VISIT WITH YOU AFTER SURGERY IN YOUR PRIVATE ROOM DURING VISITING HOURS ONLY! YOU MAY HAVE ONE PERSON SPEND THE NITE WITH YOU IN YOUR ROOM AFTER SURGERY.     Your procedure is scheduled on:    12/27/2021   Report to [facility] Main  Entrance   Report to admitting at       1000           AM DO NOT BRING INSURANCE CARD, PICTURE ID OR WALLET DAY OF SURGERY.      Call this number if you have problems the morning of surgery [phone]    REMEMBER: NO  SOLID FOODS , CANDY, GUM OR MINTS AFTER [place] .       Mar[place] Kitchen CLEAR LIQUIDS UNTIL   0900am              DAY OF SURGERY.        CLEAR LIQUID DIET   Foods Allowed      WATER BLACK COFFEE ( SUGAR OK, NO MILK, CREAM OR CREAMER) REGULAR AND DECAF  TEA ( SUGAR OK NO MILK, CREAM, OR CREAMER) REGULAR AND DECAF  PLAIN JELLO ( NO RED)  FRUIT ICES ( NO RED, NO FRUIT PULP)  POPSICLES ( NO RED)  JUICE- APPLE, WHITE GRAPE AND WHITE CRANBERRY  SPORT DRINK LIKE GATORADE ( NO RED)  CLEAR BROTH ( VEGETABLE , CHICKEN OR BEEF)                                                                     BRUSH YOUR TEETH MORNING OF SURGERY AND RINSE YOUR MOUTH OUT, NO CHEWING GUM CANDY OR MINTS.     Take these medicines the morning of surgery with A SIP OF WATER:  allopurinol, amlodipoine, coreg, eye drops as usual proscar, synthroid, flomax    DO NOT TAKE ANY DIABETIC MEDICATIONS DAY OF YOUR SURGERY                               You may not have any metal on your body including hair pins and              piercings  Do not wear jewelry, make-up, lotions, powders or perfumes, deodorant             Do not wear nail polish on your fingernails.              IF YOU ARE A MALE AND WANT TO SHAVE UNDER ARMS OR LEGS PRIOR TO SURGERY YOU MUST DO SO AT LEAST 48 HOURS PRIOR TO SURGERY.               Men may shave face and neck.   Do not bring valuables to the hospital. [place].  Contacts, dentures or bridgework may not be worn into surgery.  Leave suitcase in the car. After surgery it may  be brought to your room.     Patients discharged the day of surgery will not be allowed to drive home. IF YOU ARE HAVING SURGERY AND GOING HOME THE SAME DAY, YOU MUST HAVE AN ADULT TO DRIVE YOU HOME AND BE WITH YOU FOR 24 HOURS. YOU MAY GO HOME BY TAXI OR UBER OR ORTHERWISE, BUT AN ADULT MUST ACCOMPANY YOU HOME AND STAY WITH YOU FOR 24 HOURS.                Please read over the following fact sheets you were given: _____________________________________________________________________  [facility] - Preparing for Surgery Before surgery, you can play an important role.  Because skin is not sterile, your skin needs to be as free of germs as possible.  You can reduce the number of germs on your skin by washing with CHG (chlorahexidine gluconate) soap before surgery.  CHG is an antiseptic cleaner which kills germs and bonds with the skin to continue killing germs even after washing. Please DO NOT use if you have an allergy to CHG or antibacterial soaps.  If your skin becomes reddened/irritated stop using the CHG and inform your nurse when you arrive at Short Stay. Do not shave (including legs and underarms) for at least 48 hours prior to the first CHG shower.  You may shave your face/neck. Please follow these instructions carefully:  1.  Shower with CHG Soap the night before surgery and the  morning of Surgery.  2.  If you choose to wash your hair, wash your hair first as usual with your  normal  shampoo.  3.  After you shampoo, rinse your hair and body thoroughly to remove the  shampoo.                           4.  Use CHG as you would any other liquid soap.  You can apply chg directly  to the skin and wash                       Gently with a  scrungie or clean washcloth.  5.  Apply the CHG Soap to your body ONLY FROM THE NECK DOWN.   Do not use on face/ open                           Wound or open sores. Avoid contact with eyes, ears mouth and genitals (private parts).                       Wash face,  Genitals (private parts) with your normal soap.             6.  Wash thoroughly, paying special attention to the area where your surgery  will be performed.  7.  Thoroughly rinse your body with warm water from the neck down.  8.  DO NOT shower/wash with your normal soap after using and rinsing off  the CHG Soap.                9.  Pat yourself dry with a clean towel.            10.  Wear clean pajamas.            11.  Place clean sheets on your bed the night of your first shower and do not  sleep with pets. Day  of Surgery : Do not apply any lotions/deodorants the morning of surgery.  Please wear clean clothes to the hospital/surgery center.  FAILURE TO FOLLOW THESE INSTRUCTIONS MAY RESULT IN THE CANCELLATION OF YOUR SURGERY PATIENT SIGNATURE_________________________________  NURSE SIGNATURE__________________________________  ________________________________________________________________________

## 2021-12-21 ENCOUNTER — Other Ambulatory Visit: Payer: Self-pay

## 2021-12-21 ENCOUNTER — Encounter (HOSPITAL_COMMUNITY)
Admission: RE | Admit: 2021-12-21 | Discharge: 2021-12-21 | Disposition: A | Discharge status: Home / Self Care | Payer: Medicare Other | Source: Ambulatory Visit | Attending: Urology | Admitting: Urology

## 2021-12-21 ENCOUNTER — Encounter (HOSPITAL_COMMUNITY): Payer: Self-pay

## 2021-12-21 VITALS — BP 136/78 | HR 68 | Temp 97.8°F | Resp 18 | Ht 72.0 in | Wt 171.0 lb

## 2021-12-21 DIAGNOSIS — E139 Other specified diabetes mellitus without complications: Secondary | ICD-10-CM | POA: Insufficient documentation

## 2021-12-21 DIAGNOSIS — N401 Enlarged prostate with lower urinary tract symptoms: Secondary | ICD-10-CM | POA: Diagnosis not present

## 2021-12-21 DIAGNOSIS — Z01818 Encounter for other preprocedural examination: Secondary | ICD-10-CM

## 2021-12-21 DIAGNOSIS — R350 Frequency of micturition: Secondary | ICD-10-CM | POA: Diagnosis not present

## 2021-12-21 DIAGNOSIS — R8279 Other abnormal findings on microbiological examination of urine: Secondary | ICD-10-CM | POA: Diagnosis not present

## 2021-12-21 HISTORY — DX: Chronic kidney disease, unspecified: N18.9

## 2021-12-21 LAB — CBC
HCT: 36.9 % — ABNORMAL LOW (ref 39.0–52.0)
Hemoglobin: 12.2 g/dL — ABNORMAL LOW (ref 13.0–17.0)
MCH: 31.3 pg (ref 26.0–34.0)
MCHC: 33.1 g/dL (ref 30.0–36.0)
MCV: 94.6 fL (ref 80.0–100.0)
Platelets: 218 10*3/uL (ref 150–400)
RBC: 3.9 MIL/uL — ABNORMAL LOW (ref 4.22–5.81)
RDW: 15.4 % (ref 11.5–15.5)
WBC: 8.7 10*3/uL (ref 4.0–10.5)
nRBC: 0 % (ref 0.0–0.2)

## 2021-12-21 LAB — BASIC METABOLIC PANEL
Anion gap: 6 (ref 5–15)
BUN: 39 mg/dL — ABNORMAL HIGH (ref 8–23)
CO2: 22 mmol/L (ref 22–32)
Calcium: 9.8 mg/dL (ref 8.9–10.3)
Chloride: 110 mmol/L (ref 98–111)
Creatinine, Ser: 1.87 mg/dL — ABNORMAL HIGH (ref 0.61–1.24)
GFR, Estimated: 37 mL/min — ABNORMAL LOW (ref >60)
Glucose, Bld: 139 mg/dL — ABNORMAL HIGH (ref 70–99)
Potassium: 4.9 mmol/L (ref 3.5–5.1)
Sodium: 138 mmol/L (ref 135–145)

## 2021-12-21 LAB — SURGICAL PCR SCREEN
MRSA, PCR: POSITIVE — AB
Staphylococcus aureus: POSITIVE — AB

## 2021-12-21 LAB — HEMOGLOBIN A1C
Hgb A1c MFr Bld: 6.9 % — ABNORMAL HIGH (ref 4.8–5.6)
Mean Plasma Glucose: 151.33 mg/dL

## 2021-12-21 LAB — GLUCOSE, CAPILLARY: Glucose-Capillary: 145 mg/dL — ABNORMAL HIGH (ref 70–99)

## 2021-12-27 ENCOUNTER — Ambulatory Visit (HOSPITAL_COMMUNITY): Payer: Medicare Other | Admitting: Physician Assistant

## 2021-12-27 ENCOUNTER — Ambulatory Visit (HOSPITAL_COMMUNITY)
Admission: RE | Admit: 2021-12-27 | Discharge: 2021-12-28 | Disposition: A | Discharge status: Home / Self Care | Payer: Medicare Other | Source: Ambulatory Visit | Attending: Urology | Admitting: Urology

## 2021-12-27 ENCOUNTER — Ambulatory Visit (HOSPITAL_BASED_OUTPATIENT_CLINIC_OR_DEPARTMENT_OTHER): Payer: Medicare Other | Admitting: Certified Registered Nurse Anesthetist

## 2021-12-27 ENCOUNTER — Encounter (HOSPITAL_COMMUNITY): Payer: Self-pay | Admitting: Urology

## 2021-12-27 ENCOUNTER — Encounter (HOSPITAL_COMMUNITY)
Admission: RE | Disposition: A | Discharge status: Home / Self Care | Payer: Self-pay | Source: Ambulatory Visit | Attending: Urology

## 2021-12-27 ENCOUNTER — Other Ambulatory Visit: Payer: Self-pay

## 2021-12-27 DIAGNOSIS — Z87440 Personal history of urinary (tract) infections: Secondary | ICD-10-CM | POA: Diagnosis not present

## 2021-12-27 DIAGNOSIS — E039 Hypothyroidism, unspecified: Secondary | ICD-10-CM | POA: Diagnosis not present

## 2021-12-27 DIAGNOSIS — N400 Enlarged prostate without lower urinary tract symptoms: Secondary | ICD-10-CM

## 2021-12-27 DIAGNOSIS — N189 Chronic kidney disease, unspecified: Secondary | ICD-10-CM

## 2021-12-27 DIAGNOSIS — N281 Cyst of kidney, acquired: Secondary | ICD-10-CM | POA: Insufficient documentation

## 2021-12-27 DIAGNOSIS — I129 Hypertensive chronic kidney disease with stage 1 through stage 4 chronic kidney disease, or unspecified chronic kidney disease: Secondary | ICD-10-CM

## 2021-12-27 DIAGNOSIS — N138 Other obstructive and reflux uropathy: Secondary | ICD-10-CM | POA: Diagnosis not present

## 2021-12-27 DIAGNOSIS — M532X6 Spinal instabilities, lumbar region: Secondary | ICD-10-CM

## 2021-12-27 DIAGNOSIS — Z01818 Encounter for other preprocedural examination: Secondary | ICD-10-CM

## 2021-12-27 DIAGNOSIS — N401 Enlarged prostate with lower urinary tract symptoms: Secondary | ICD-10-CM | POA: Insufficient documentation

## 2021-12-27 LAB — CBC
HCT: 36.9 % — ABNORMAL LOW (ref 39.0–52.0)
Hemoglobin: 12.4 g/dL — ABNORMAL LOW (ref 13.0–17.0)
MCH: 31.2 pg (ref 26.0–34.0)
MCHC: 33.6 g/dL (ref 30.0–36.0)
MCV: 92.7 fL (ref 80.0–100.0)
Platelets: 230 10*3/uL (ref 150–400)
RBC: 3.98 MIL/uL — ABNORMAL LOW (ref 4.22–5.81)
RDW: 15.3 % (ref 11.5–15.5)
WBC: 11.6 10*3/uL — ABNORMAL HIGH (ref 4.0–10.5)
nRBC: 0 % (ref 0.0–0.2)

## 2021-12-27 LAB — BASIC METABOLIC PANEL
Anion gap: 9 (ref 5–15)
BUN: 45 mg/dL — ABNORMAL HIGH (ref 8–23)
CO2: 18 mmol/L — ABNORMAL LOW (ref 22–32)
Calcium: 9.8 mg/dL (ref 8.9–10.3)
Chloride: 110 mmol/L (ref 98–111)
Creatinine, Ser: 2.11 mg/dL — ABNORMAL HIGH (ref 0.61–1.24)
GFR, Estimated: 32 mL/min — ABNORMAL LOW (ref >60)
Glucose, Bld: 153 mg/dL — ABNORMAL HIGH (ref 70–99)
Potassium: 5.1 mmol/L (ref 3.5–5.1)
Sodium: 137 mmol/L (ref 135–145)

## 2021-12-27 SURGERY — Holmium Laser Enucleation of the Prostate with Morcellation
Anesthesia: General

## 2021-12-27 MED ORDER — PROPOFOL 10 MG/ML IV BOLUS
INTRAVENOUS | Status: DC | PRN
  Administered 2021-12-27: 150 mg via INTRAVENOUS

## 2021-12-27 MED ORDER — MORPHINE SULFATE (PF) 2 MG/ML IV SOLN: 2.0000 mg | INTRAVENOUS | Status: DC | PRN

## 2021-12-27 MED ORDER — FENTANYL CITRATE (PF) 100 MCG/2ML IJ SOLN
INTRAMUSCULAR | Status: AC
  Filled 2021-12-27: qty 2

## 2021-12-27 MED ORDER — GENTAMICIN SULFATE 40 MG/ML IJ SOLN
5.0000 mg/kg | INTRAVENOUS | Status: DC
  Filled 2021-12-27: qty 9.75

## 2021-12-27 MED ORDER — STERILE WATER FOR IRRIGATION IR SOLN
Status: DC | PRN
  Administered 2021-12-27: 500 mL

## 2021-12-27 MED ORDER — DEXAMETHASONE SODIUM PHOSPHATE 10 MG/ML IJ SOLN
INTRAMUSCULAR | Status: DC | PRN
  Administered 2021-12-27: 5 mg via INTRAVENOUS

## 2021-12-27 MED ORDER — ROCURONIUM BROMIDE 10 MG/ML (PF) SYRINGE
PREFILLED_SYRINGE | INTRAVENOUS | Status: AC
  Filled 2021-12-27: qty 10

## 2021-12-27 MED ORDER — BISACODYL 10 MG RE SUPP: 10.0000 mg | Freq: Every day | RECTAL | Status: DC | PRN

## 2021-12-27 MED ORDER — SODIUM CHLORIDE 0.9% FLUSH: 3.0000 mL | Freq: Two times a day (BID) | INTRAVENOUS | Status: DC

## 2021-12-27 MED ORDER — SODIUM CHLORIDE 0.45 % IV SOLN: INTRAVENOUS | Status: DC

## 2021-12-27 MED ORDER — METHOCARBAMOL 500 MG PO TABS
500.0000 mg | ORAL_TABLET | Freq: Four times a day (QID) | ORAL | Status: DC
  Filled 2021-12-27: qty 1

## 2021-12-27 MED ORDER — OXYBUTYNIN CHLORIDE ER 10 MG PO TB24
10.0000 mg | ORAL_TABLET | Freq: Every day | ORAL | Status: DC
  Filled 2021-12-27: qty 1

## 2021-12-27 MED ORDER — PHENYLEPHRINE 80 MCG/ML (10ML) SYRINGE FOR IV PUSH (FOR BLOOD PRESSURE SUPPORT)
PREFILLED_SYRINGE | INTRAVENOUS | Status: DC | PRN
  Administered 2021-12-27 (×2): 80 ug via INTRAVENOUS

## 2021-12-27 MED ORDER — FENTANYL CITRATE (PF) 100 MCG/2ML IJ SOLN
INTRAMUSCULAR | Status: DC | PRN
  Administered 2021-12-27 (×2): 50 ug via INTRAVENOUS

## 2021-12-27 MED ORDER — CIPROFLOXACIN HCL 500 MG PO TABS: 500.0000 mg | ORAL_TABLET | Freq: Two times a day (BID) | ORAL | 0 refills | Status: AC

## 2021-12-27 MED ORDER — OXYCODONE-ACETAMINOPHEN 5-325 MG PO TABS: 1.0000 | ORAL_TABLET | ORAL | Status: DC | PRN

## 2021-12-27 MED ORDER — LISINOPRIL 20 MG PO TABS
20.0000 mg | ORAL_TABLET | Freq: Every day | ORAL | Status: DC
  Administered 2021-12-27 – 2021-12-28 (×2): 20 mg via ORAL
  Filled 2021-12-27 (×2): qty 1

## 2021-12-27 MED ORDER — ALLOPURINOL 100 MG PO TABS
100.0000 mg | ORAL_TABLET | Freq: Every day | ORAL | Status: DC
  Administered 2021-12-28: 100 mg via ORAL
  Filled 2021-12-27: qty 1

## 2021-12-27 MED ORDER — ONDANSETRON HCL 4 MG/2ML IJ SOLN: 4.0000 mg | INTRAMUSCULAR | Status: DC | PRN

## 2021-12-27 MED ORDER — DOCUSATE SODIUM 100 MG PO CAPS: 100.0000 mg | ORAL_CAPSULE | Freq: Every day | ORAL | 0 refills | Status: AC | PRN

## 2021-12-27 MED ORDER — DIPHENHYDRAMINE HCL 50 MG/ML IJ SOLN: 12.5000 mg | Freq: Four times a day (QID) | INTRAMUSCULAR | Status: DC | PRN

## 2021-12-27 MED ORDER — LIDOCAINE HCL (PF) 2 % IJ SOLN
INTRAMUSCULAR | Status: AC
  Filled 2021-12-27: qty 5

## 2021-12-27 MED ORDER — POLYETHYLENE GLYCOL 3350 17 G PO PACK: 17.0000 g | PACK | Freq: Every day | ORAL | Status: DC | PRN

## 2021-12-27 MED ORDER — PROPOFOL 10 MG/ML IV BOLUS
INTRAVENOUS | Status: AC
  Filled 2021-12-27: qty 20

## 2021-12-27 MED ORDER — AMISULPRIDE (ANTIEMETIC) 5 MG/2ML IV SOLN: 10.0000 mg | Freq: Once | INTRAVENOUS | Status: DC | PRN

## 2021-12-27 MED ORDER — ACETAMINOPHEN 500 MG PO TABS
1000.0000 mg | ORAL_TABLET | Freq: Once | ORAL | Status: AC
Start: 2021-12-27 — End: 2021-12-27
  Administered 2021-12-27: 1000 mg via ORAL
  Filled 2021-12-27: qty 2

## 2021-12-27 MED ORDER — ROCURONIUM BROMIDE 10 MG/ML (PF) SYRINGE
PREFILLED_SYRINGE | INTRAVENOUS | Status: DC | PRN
  Administered 2021-12-27 (×2): 20 mg via INTRAVENOUS
  Administered 2021-12-27: 50 mg via INTRAVENOUS

## 2021-12-27 MED ORDER — ORAL CARE MOUTH RINSE: 15.0000 mL | Freq: Once | OROMUCOSAL | Status: AC

## 2021-12-27 MED ORDER — SODIUM CHLORIDE 0.9 % IR SOLN
Status: DC | PRN
  Administered 2021-12-27: 39000 mL

## 2021-12-27 MED ORDER — OXYCODONE-ACETAMINOPHEN 5-325 MG PO TABS: 1.0000 | ORAL_TABLET | ORAL | 0 refills | Status: AC | PRN

## 2021-12-27 MED ORDER — TRIPLE ANTIBIOTIC 3.5-400-5000 EX OINT: 1.0000 | TOPICAL_OINTMENT | Freq: Three times a day (TID) | CUTANEOUS | Status: DC | PRN

## 2021-12-27 MED ORDER — SODIUM CHLORIDE 0.9% FLUSH: 3.0000 mL | INTRAVENOUS | Status: DC | PRN

## 2021-12-27 MED ORDER — DIPHENHYDRAMINE HCL 12.5 MG/5ML PO ELIX: 12.5000 mg | ORAL_SOLUTION | Freq: Four times a day (QID) | ORAL | Status: DC | PRN

## 2021-12-27 MED ORDER — SODIUM CHLORIDE 0.9 % IV SOLN: 250.0000 mL | INTRAVENOUS | Status: DC | PRN

## 2021-12-27 MED ORDER — DEXAMETHASONE SODIUM PHOSPHATE 10 MG/ML IJ SOLN
INTRAMUSCULAR | Status: AC
  Filled 2021-12-27: qty 1

## 2021-12-27 MED ORDER — ONDANSETRON HCL 4 MG/2ML IJ SOLN
INTRAMUSCULAR | Status: AC
  Filled 2021-12-27: qty 2

## 2021-12-27 MED ORDER — CYCLOSPORINE 0.05 % OP EMUL
1.0000 [drp] | Freq: Every day | OPHTHALMIC | Status: DC
Start: 2021-12-28 — End: 2021-12-28
  Filled 2021-12-27: qty 30

## 2021-12-27 MED ORDER — ONDANSETRON HCL 4 MG/2ML IJ SOLN
INTRAMUSCULAR | Status: DC | PRN
  Administered 2021-12-27: 4 mg via INTRAVENOUS

## 2021-12-27 MED ORDER — TAMSULOSIN HCL 0.4 MG PO CAPS
0.4000 mg | ORAL_CAPSULE | Freq: Every day | ORAL | Status: DC
  Administered 2021-12-28: 0.4 mg via ORAL
  Filled 2021-12-27: qty 1

## 2021-12-27 MED ORDER — FENTANYL CITRATE PF 50 MCG/ML IJ SOSY: 25.0000 ug | PREFILLED_SYRINGE | INTRAMUSCULAR | Status: DC | PRN

## 2021-12-27 MED ORDER — FINASTERIDE 5 MG PO TABS
5.0000 mg | ORAL_TABLET | Freq: Every day | ORAL | Status: DC
  Administered 2021-12-28: 5 mg via ORAL
  Filled 2021-12-27: qty 1

## 2021-12-27 MED ORDER — TRANEXAMIC ACID-NACL 1000-0.7 MG/100ML-% IV SOLN
1000.0000 mg | Freq: Once | INTRAVENOUS | Status: AC
  Administered 2021-12-27: 1000 mg via INTRAVENOUS
  Filled 2021-12-27: qty 100

## 2021-12-27 MED ORDER — LEVOTHYROXINE SODIUM 50 MCG PO TABS
50.0000 ug | ORAL_TABLET | Freq: Every day | ORAL | Status: DC
  Administered 2021-12-28: 50 ug via ORAL
  Filled 2021-12-27: qty 1

## 2021-12-27 MED ORDER — EPHEDRINE SULFATE-NACL 50-0.9 MG/10ML-% IV SOSY
PREFILLED_SYRINGE | INTRAVENOUS | Status: DC | PRN
  Administered 2021-12-27: 10 mg via INTRAVENOUS
  Administered 2021-12-27: 5 mg via INTRAVENOUS
  Administered 2021-12-27: 10 mg via INTRAVENOUS

## 2021-12-27 MED ORDER — AMLODIPINE BESYLATE 10 MG PO TABS
10.0000 mg | ORAL_TABLET | Freq: Every day | ORAL | Status: DC
  Administered 2021-12-28: 10 mg via ORAL
  Filled 2021-12-27: qty 1

## 2021-12-27 MED ORDER — LIDOCAINE 2% (20 MG/ML) 5 ML SYRINGE
INTRAMUSCULAR | Status: DC | PRN
  Administered 2021-12-27: 60 mg via INTRAVENOUS

## 2021-12-27 MED ORDER — CHLORHEXIDINE GLUCONATE 0.12 % MT SOLN
15.0000 mL | Freq: Once | OROMUCOSAL | Status: AC
  Administered 2021-12-27: 15 mL via OROMUCOSAL

## 2021-12-27 MED ORDER — LACTATED RINGERS IV SOLN: INTRAVENOUS | Status: DC

## 2021-12-27 MED ORDER — CARVEDILOL 12.5 MG PO TABS
12.5000 mg | ORAL_TABLET | Freq: Two times a day (BID) | ORAL | Status: DC
  Administered 2021-12-28: 12.5 mg via ORAL
  Filled 2021-12-27 (×2): qty 1

## 2021-12-27 MED ORDER — DOCUSATE SODIUM 100 MG PO CAPS
100.0000 mg | ORAL_CAPSULE | Freq: Two times a day (BID) | ORAL | Status: DC
  Administered 2021-12-27 – 2021-12-28 (×2): 100 mg via ORAL
  Filled 2021-12-27 (×2): qty 1

## 2021-12-27 MED ORDER — 0.9 % SODIUM CHLORIDE (POUR BTL) OPTIME
TOPICAL | Status: DC | PRN
  Administered 2021-12-27: 1000 mL

## 2021-12-27 MED ORDER — GENTAMICIN SULFATE 40 MG/ML IJ SOLN
5.0000 mg/kg | Freq: Once | INTRAVENOUS | Status: AC
  Administered 2021-12-27: 390 mg via INTRAVENOUS
  Filled 2021-12-27: qty 9.75

## 2021-12-27 MED ORDER — TRANEXAMIC ACID-NACL 1000-0.7 MG/100ML-% IV SOLN
INTRAVENOUS | Status: AC
  Filled 2021-12-27: qty 100

## 2021-12-27 MED ORDER — ACETAMINOPHEN 325 MG PO TABS: 650.0000 mg | ORAL_TABLET | ORAL | Status: DC | PRN

## 2021-12-27 MED ORDER — SUGAMMADEX SODIUM 200 MG/2ML IV SOLN
INTRAVENOUS | Status: DC | PRN
  Administered 2021-12-27: 200 mg via INTRAVENOUS

## 2021-12-27 MED ORDER — SODIUM CHLORIDE 0.9 % IR SOLN
3000.0000 mL | Status: DC
  Administered 2021-12-27: 3000 mL

## 2021-12-27 SURGICAL SUPPLY — 35 items
ADAPTER IRRIG TUBE 2 SPIKE SOL (ADAPTER) ×4 IMPLANT
ADPR TBG 2 SPK PMP STRL ASCP (ADAPTER)
BAG URO CATCHER STRL LF (MISCELLANEOUS) ×3 IMPLANT
CATH FOLEY 3WAY 30CC 22FR (CATHETERS) ×1 IMPLANT
CATH FOLEY 3WAY 30CC 24FR (CATHETERS)
CATH URETL OPEN 5X70 (CATHETERS) ×3 IMPLANT
CATH URTH STD 24FR FL 3W 2 (CATHETERS) ×2 IMPLANT
CONTAINER COLLECT MORCELLATR (MISCELLANEOUS) ×2 IMPLANT
DRAPE UTILITY 15X26 TOWEL STRL (DRAPES) IMPLANT
ELECT BIVAP BIPO 22/24 DONUT (ELECTROSURGICAL)
ELECTRD BIVAP BIPO 22/24 DONUT (ELECTROSURGICAL) IMPLANT
FIBER LASER MOSES 550 DFL (Laser) ×3 IMPLANT
FILTER OVERFLOW MORCELLATOR (FILTER) ×2 IMPLANT
GLOVE BIOGEL M 7.0 STRL (GLOVE) ×3 IMPLANT
GOWN STRL REUS W/ TWL XL LVL3 (GOWN DISPOSABLE) ×2 IMPLANT
GOWN STRL REUS W/TWL XL LVL3 (GOWN DISPOSABLE) ×2
HOLDER FOLEY CATH W/STRAP (MISCELLANEOUS) ×3 IMPLANT
LOOP CUT BIPOLAR 24F LRG (ELECTROSURGICAL) ×3 IMPLANT
MBRN O SEALING YLW 17 FOR INST (MISCELLANEOUS)
MEMBRANE SLNG YLW 17 FOR INST (MISCELLANEOUS) ×2 IMPLANT
MORCELLATOR COLLECT CONTAINER (MISCELLANEOUS)
MORCELLATOR OVERFLOW FILTER (FILTER)
MORCELLATOR ROTATION 4.75 335 (MISCELLANEOUS) ×2 IMPLANT
PACK CYSTO (CUSTOM PROCEDURE TRAY) ×3 IMPLANT
PIN SAFETY STERILE (MISCELLANEOUS) ×2 IMPLANT
SET IRRIG Y TYPE TUR BLADDER L (SET/KITS/TRAYS/PACK) IMPLANT
SLEEVE SURGEON STRL (DRAPES) ×4 IMPLANT
SYR 30ML LL (SYRINGE) ×3 IMPLANT
SYR TOOMEY IRRIG 70ML (MISCELLANEOUS) ×2
SYRINGE TOOMEY IRRIG 70ML (MISCELLANEOUS) ×2 IMPLANT
TUBE PUMP MORCELLATOR PIRANHA (TUBING) ×2 IMPLANT
TUBING CONNECTING 10 (TUBING) ×3 IMPLANT
TUBING UROLOGY SET (TUBING) ×3 IMPLANT
WATER STERILE IRR 1000ML POUR (IV SOLUTION) ×3 IMPLANT
WATER STERILE IRR 500ML POUR (IV SOLUTION) ×3 IMPLANT

## 2021-12-27 NOTE — H&P (Signed)
Office Visit Report     12/21/2021     CC/HPI: Pt presents today for pre-operative history and physical exam in anticipation of HoLEP with tissue morcellation by Dr. Abner Greenspan on 12/27/21. He is doing well and is without complaint.   Pt denies F/C, HA, CP, SOB, N/V, diarrhea/constipation, back pain, flank pain, hematuria, and dysuria.     HX:   Mr. Neil Whitaker is a 76 year old male seen in follow-up today with BPH and rUTI.   1. BPH/LUTS:  -CT A/P 02/10/2020 measured prostate at 5.9 x 5.3 x 5.9 cm equating to a roughly 90cm^3 prostate.  -IPSS score 9, quality-of-life 2. He does report sensation, bladder emptying. He has some mild frequency and urgency. He has a fair flow stream. He has 1 time nocturia. PVR in 2022, 22 mL.  -He remains on tamsulosin 0.4 mg daily and finasteride 5 mg daily with benefit.  -He states that his symptoms are worsening particular the past several months and is now interested in bladder outlet obstruction procedure.   #2. Prostate cancer screening: PSA on 03/11/2021 was 1.07 and when correcting for use of finasteride is 2. He denies a family history of prostate cancer.   3. Urolithiasis:  -CT A/P 02/10/2020 demonstrated 2 mm nonobstructive left lower pole stone. He remains asymptomatic without abdominal pain or flank pain. He prefers to continue surveillance.   4. Bilateral renal cysts:  CT A/P 02/10/2020 demonstrated atrophic bilateral kidneys with numerous low attenuating lesions that were likely cysts.   5. Recurrent urinary tract infections:  -He noted that he had a UTI a few months ago this with antibiotics. He does complain of worsening frequency and urgency. He denies dysuria today. He is wondering if he has a urinary tract infection. Urinalysis today with evidence of inflammation however negative nitrites.   Patient currently denies fever, chills, sweats, nausea, vomiting, abdominal or flank pain, gross hematuria or dysuria.   He is retired and likes to go on cruises  biannually. He has an upcoming cruise in the [place].     ALLERGIES: penicillin - Skin Rash, over 50 years ago    MEDICATIONS: Allopurinol 100 mg tablet  Finasteride 5 mg tablet  Lisinopril 20 mg tablet  Tamsulosin Hcl 0.4 mg capsule  Amlodipine Besylate 10 mg tablet  Carvedilol 12.5 mg tablet  Fish Oil Omega-3 360 mg-1,200 mg capsule  Levothyroxine Sodium 50 mcg capsule  Restasis Opth 0.4 Ml     GU PSH: Cystoscopy - 02/11/2020 Locm 300-'399Mg'$ /Ml Iodine,1Ml - 03/04/2020       PSH Notes: dental surgeries   NON-GU PSH: Appendectomy - 1960 Back Surgery (Unspecified) - 07/27/2021 Carpal tunnel surgery, Right Hemorrhoidectomy - 1970     GU PMH: BPH w/LUTS - 09/16/2021, - 03/18/2021, - 03/16/2020, - 02/04/2020 Renal calculus - 09/16/2021, - 03/18/2021, - 03/16/2020, - 02/11/2020 Renal cyst - 09/16/2021, - 03/18/2021, - 03/16/2020, - 03/04/2020, - 02/11/2020 Urinary Frequency (Stable) - 09/16/2021, - 03/18/2021, - 03/16/2020, - 02/04/2020 Chronic cystitis (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   Tobacco Use Assessment Completed: Used Tobacco in last 30 days?  Has never drank.  Does not use drugs. Drinks 3 caffeinated drinks per day. Has not had a blood transfusion. Patient's occupation is/was Retired.    REVIEW OF SYSTEMS:    GU Review Male:   Patient reports frequent urination, hard to postpone urination, get up at night to urinate, and leakage of urine. Patient denies burning/ pain with urination, stream starts and stops, trouble  starting your stream, have to strain to urinate , erection problems, and penile pain.  Gastrointestinal (Upper):   Patient denies nausea, vomiting, and indigestion/ heartburn.  Gastrointestinal (Lower):   Patient denies diarrhea and constipation.  Constitutional:   Patient denies fever, night sweats, weight loss, and fatigue.  Skin:   Patient denies skin rash/ lesion and itching.  Eyes:   Patient denies blurred vision and double vision.  Ears/ Nose/ Throat:   Patient denies sore throat and sinus problems.  Hematologic/Lymphatic:   Patient denies swollen glands and easy bruising.  Cardiovascular:   Patient denies leg swelling and chest pains.  Respiratory:   Patient denies cough and shortness of breath.  Endocrine:   Patient denies excessive thirst.  Musculoskeletal:   Patient reports back pain. Patient denies joint pain.  Neurological:   Patient denies headaches and dizziness.  Psychologic:   Patient denies depression and anxiety.   VITAL SIGNS:      12/21/2021 01:30 PM  BP 118/68 mmHg  Pulse 67 /min  Temperature 97.1 F / 36.1 C   MULTI-SYSTEM PHYSICAL EXAMINATION:    Constitutional: Well-nourished. No physical deformities. Normally developed. Good grooming.  Neck: Neck symmetrical, not swollen. Normal tracheal position.  Respiratory: Normal breath sounds. No labored breathing, no use of accessory muscles.   Cardiovascular: Regular rate and rhythm. No murmur, no gallop.   Lymphatic: No enlargement of neck, axillae, groin.  Skin: No paleness, no jaundice, no cyanosis. No lesion, no ulcer, no rash.  Neurologic / Psychiatric: Oriented to time, oriented to place, oriented to person. No depression, no anxiety, no agitation.  Gastrointestinal: No mass, no tenderness, no rigidity, non obese abdomen.  Eyes: Normal conjunctivae. Normal eyelids.  Ears, Nose, Mouth, and Throat: Left ear no scars, no lesions, no masses. Right ear no scars, no lesions, no masses. Nose no scars, no lesions, no  masses. Normal hearing. Normal lips.  Musculoskeletal: Normal gait and station of head and neck.     Complexity of Data:  Records Review:   Previous Patient Records  Urine Test Review:   Urinalysis   12/21/21  Urinalysis  Urine Appearance Slightly Cloudy   Urine Color Yellow   Urine Glucose Neg mg/dL  Urine Bilirubin Neg mg/dL  Urine Ketones Neg mg/dL  Urine Specific Gravity 1.020   Urine Blood Trace ery/uL  Urine pH <=5.0   Urine Protein Neg mg/dL  Urine Urobilinogen 0.2 mg/dL  Urine Nitrites Neg   Urine Leukocyte Esterase 3+ leu/uL  Urine WBC/hpf 10 - 20/hpf   Urine RBC/hpf 0 - 2/hpf   Urine Epithelial Cells NS (Not Seen)   Urine Bacteria Few (10-25/hpf)   Urine Mucous Not Present   Urine Yeast NS (Not Seen)   Urine Trichomonas Not Present   Urine Cystals NS (Not Seen)   Urine Casts NS (Not Seen)   Urine Sperm Not Present    PROCEDURES:          Urinalysis w/Scope - 81001 Dipstick Dipstick Cont'd Micro  Color: Yellow Bilirubin: Neg mg/dL WBC/hpf: 10 - 20/hpf  Appearance: Slightly Cloudy Ketones: Neg mg/dL RBC/hpf: 0 -  2/hpf  Specific Gravity: 1.020 Blood: Trace ery/uL Bacteria: Few (10-25/hpf)  pH: <=5.0 Protein: Neg mg/dL Cystals: NS (Not Seen)  Glucose: Neg mg/dL Urobilinogen: 0.2 mg/dL Casts: NS (Not Seen)    Nitrites: Neg Trichomonas: Not Present    Leukocyte Esterase: 3+ leu/uL Mucous: Not Present      Epithelial Cells: NS (Not Seen)      Yeast: NS (Not Seen)      Sperm: Not Present    Notes: qns to spin    ASSESSMENT:      ICD-10 Details  1 GU:   BPH w/LUTS - N40.1    PLAN:           Orders Labs Urine Culture          Schedule Return Visit/Planned Activity: Keep Scheduled Appointment - Schedule Surgery          Document Letter(s):  Created for Patient: Clinical Summary         Notes:   There are no changes in the patients history or physical exam since last evaluation by Dr. Abner Greenspan. Pt is scheduled to undergo HoLEP with tissue morcellation on  12/27/21.   UA questionable for infection. Will check culture and treat if necessary.   All pt's questions were answered to the best of my ability.          Next Appointment:      Next Appointment: 12/27/2021 12:00 PM    Appointment Type: Surgery     Location: [facility] [phone]    Provider: Rexene Alberts, M.D.    Reason for Visit: [facility]/EXT REC HOLEP [place] [facility] TISSUE [facility]   Urology Preoperative H&P   Chief Complaint: BPH  History of Present Illness: Neil Whitaker is a 76 y.o. male with BPH here for HoLEP. Denies fevers, chills, dysuria. Preop Ucx 12/21/2021 sensitive to gentamicin.   Past Medical History:  Diagnosis Date   ANA positive    BPH (benign prostatic hyperplasia)    Chronic kidney disease    Degenerative cervical disc    WITH OCCASIONAL NERVE ROOT IRRITATION   Elevated blood uric acid level    Gout of big toe    H/O prostatitis    Hearing loss    BILATERAL HEARING AID   History of elevated PSA    History of kidney stones    Hypercholesterolemia    Hypertension    Low back pain    Pre-diabetes    Subclinical hypothyroidism    Synovitis of wrist     Past Surgical History:  Procedure Laterality Date   APPENDECTOMY     BACK SURGERY     colonoscopoy      EYE SURGERY Bilateral    cataract   HEMORRHOIDECTOMY WITH HEMORRHOID BANDING     right carpal tunnel release       Allergies:  Allergies  Allergen Reactions   Penicillins     Rash on stomach 50 years ago    Family History  Problem Relation Age of Onset   Cancer Mother        KIDNEY   Gout Father    Lupus Sister    CVA Paternal Grandmother    Heart attack Paternal Grandfather     Social History:  reports that he has never smoked. He has never used smokeless tobacco. He reports that he does not currently use alcohol. He reports that he does not use drugs.  ROS: A complete review of systems was performed.  All  systems are negative except for pertinent findings as  noted.  Physical Exam:  Vital signs in last 24 hours: Temp:  [97.6 F (36.4 C)] 97.6 F (36.4 C) (07/17 0959) Pulse Rate:  [62] 62 (07/17 0959) Resp:  [15] 15 (07/17 0959) BP: (124)/(74) 124/74 (07/17 0959) SpO2:  [98 %] 98 % (07/17 0959) Constitutional:  Alert and oriented, No acute distress Cardiovascular: Regular rate and rhythm Respiratory: Normal respiratory effort, Lungs clear bilaterally GI: Abdomen is soft, nontender, nondistended, no abdominal masses GU: No CVA tenderness Lymphatic: No lymphadenopathy Neurologic: Grossly intact, no focal deficits Psychiatric: Normal mood and affect  Laboratory Data:  No results for input(s): "WBC", "HGB", "HCT", "PLT" in the last 72 hours.  No results for input(s): "NA", "K", "CL", "GLUCOSE", "BUN", "CALCIUM", "CREATININE" in the last 72 hours.  Invalid input(s): "CO3"   No results found for this or any previous visit (from the past 24 hour(s)). Recent Results (from the past 240 hour(s))  Surgical pcr screen     Status: Abnormal   Collection Time: 12/21/21 10:21 AM   Specimen: Nasal Mucosa; Nasal Swab  Result Value Ref Range Status   MRSA, PCR POSITIVE (A) NEGATIVE Final    Comment: RESULT CALLED TO, READ BACK BY AND VERIFIED WITH: RN T HESTER AT 1458 12/21/21 CRUICKSHANK A    Staphylococcus aureus POSITIVE (A) NEGATIVE Final    Comment: RESULT CALLED TO, READ BACK BY AND VERIFIED WITH: RN T HESTER AT 2025 12/21/21 CRUICKSHANK A (NOTE) The Xpert SA Assay (FDA approved for NASAL specimens in patients 11 years of age and older), is one component of a comprehensive surveillance program. It is not intended to diagnose infection nor to guide or monitor treatment. Performed at [facility], [place] [address], [place], [place] [address]     Renal Function: Recent Labs    12/21/21 0954  CREATININE 1.87*   Estimated Creatinine Clearance: 36.3 mL/min (A) (by C-G formula based on SCr of 1.87 mg/dL  (H)).  Radiologic Imaging: No results found.  I independently reviewed the above imaging studies.  Assessment and Plan Whitaker Holderman is a 76 y.o. male with BPH here for HoLEP, possible TURP. Ok to proceed.  Matt R. Sanjiv Castorena MD 12/27/2021, 10:09 AM  [facility] Pager: [phone]): [phone]

## 2021-12-27 NOTE — Discharge Instructions (Signed)

## 2021-12-27 NOTE — Anesthesia Preprocedure Evaluation (Signed)
Anesthesia Evaluation  Patient identified by MRN, date of birth, ID band Patient awake    Reviewed: Allergy & Precautions, NPO status , Patient's Chart, lab work & pertinent test results  Airway Mallampati: II  TM Distance: >3 FB Neck ROM: Full    Dental  (+) Dental Advisory Given   Pulmonary neg pulmonary ROS,    breath sounds clear to auscultation       Cardiovascular hypertension, Pt. on medications and Pt. on home beta blockers  Rhythm:Regular Rate:Normal     Neuro/Psych negative neurological ROS     GI/Hepatic negative GI ROS,   Endo/Other  Hypothyroidism   Renal/GU CRFRenal disease     Musculoskeletal   Abdominal   Peds  Hematology  (+) Blood dyscrasia, anemia ,   Anesthesia Other Findings   Reproductive/Obstetrics                             Lab Results  Component Value Date   WBC 8.7 12/21/2021   HGB 12.2 (L) 12/21/2021   HCT 36.9 (L) 12/21/2021   MCV 94.6 12/21/2021   PLT 218 12/21/2021   Lab Results  Component Value Date   CREATININE 1.87 (H) 12/21/2021   BUN 39 (H) 12/21/2021   NA 138 12/21/2021   K 4.9 12/21/2021   CL 110 12/21/2021   CO2 22 12/21/2021    Anesthesia Physical Anesthesia Plan  ASA: 3  Anesthesia Plan: General   Post-op Pain Management: Tylenol PO (pre-op)*   Induction: Intravenous  PONV Risk Score and Plan: 2 and Dexamethasone, Ondansetron and Treatment may vary due to age or medical condition  Airway Management Planned: Oral ETT  Additional Equipment: None  Intra-op Plan:   Post-operative Plan: Extubation in OR  Informed Consent: I have reviewed the patients History and Physical, chart, labs and discussed the procedure including the risks, benefits and alternatives for the proposed anesthesia with the patient or authorized representative who has indicated his/her understanding and acceptance.     Dental advisory given  Plan  Discussed with: CRNA  Anesthesia Plan Comments:         Anesthesia Quick Evaluation

## 2021-12-27 NOTE — Op Note (Signed)
Operative Note  Preoperative diagnosis:  1.  Bladder outlet obstruction. 2.  Benign prostatic hyperplasia.  Postoperative diagnosis: 1.  Bladder outlet obstruction. 2.  Benign prostatic hyperplasia.  Procedure(s): 1.  Cystoscopy 2.  Holmium laser enucleation of the prostate converted to transurethral resection of the prostate  Surgeon: Rexene Alberts, MD  Assistants:  None  Anesthesia:  General  Complications:  None  EBL:  Minimal  Specimens: 1. Prostate chips ID Type Source Tests Collected by Time Destination  1 : Prostate chips Tissue PATH Prostate TURP SURGICAL PATHOLOGY Janith Lima, MD 12/27/2021 1243    Drains/Catheters: 1.  22-French 3-way Foley catheter with 18m water in balloon  Intraoperative findings:   1.  No bladder lesions. 2.  Ureteral orifices in orthotopic position. 3.  Plus +2 trabeculation.  Indication:  Neil Whitaker a 76y.o. male with a history of bladder outlet obstruction and benign prostatic hyperplasia.  Risks, benefits and alternatives were explained and the patient decided to proceed.  Description of procedure: The indications, alternatives, benefits and risks were discussed with the patient and informed consent was obtained.  The patient was brought onto the operating room table, positioned supine and secured with a safety strap.  Pneumatic compression devices were placed on the lower extremities.  After administration of intravenous antibiotics and general anesthesia, the patient was repositioned in the dorsal lithotomy position and all pressure points were carefully padded.  The genitalia were prepped and draped in the standard sterile manner.  Timeout was completed, verifying the correct patient, surgical procedure, and positioning prior to beginning the procedure.  Isotonic normal saline was used for irrigation.  The patient's urethra was calibrated to 30 FP[place]with sequential VOwens-Illinoissounds.  Next, a 248Frenchcontinuous-flow  resectoscope was inserted into the patient's bladder using the visual obturator.  This was then exchanged for the laser bridge.  On cystoscopic evaluation, there were no tumors, stones or foreign bodies or diverticula present.  The bladder wall appeared trabeculated.  Both orifices were in the normal anatomic position with clear urinary reflux noted bilaterally.  The location of the ureteral orifices and prostatic configuration was again confirmed.  Using the 550 m holmium Moses laser fiber, we first made an incision starting above the verumontanum taking this down to the capsule.  We then made an incision starting at the bladder neck at 5 and 7 o'clock, taking these down to the level of the verumontanum. The prostate was quite friable and there was bleeding that was unable to be controlled with the laser. I elected to convert to a transurethral resection of the prostate.   The obturator was removed and replaced by the working element with a resection loop.  The location of the ureteral orifices and the prostatic configuration were again confirmed.  Starting at the bladder neck and proceeding distally to the verumontanum a transurethral section of the prostate was performed using bipolar using energy of 4 and 5 for cutting and coagulation, respectively.  The procedure began at the bladder neck at the 5 o'clock and 7 o'clock positions and carefully carried distally to the verumontanum, resecting the intervening prostatic adenoma.  Next the left lateral lobe was resected to the level of the transverse capsular fibers.  The identical procedure was performed on the right lobe.  Attention was then directed anteriorly and the resection was completed from the 10 o'clock to 2 o'clock positions.  All bleeding vessels were fulgurated achieving meticulous hemostasis.  The bladder was irrigated  with a Toomey syringe, ensuring removal of all prostate chips which were sent to pathology for evaluation.  Having completed  the resection and the chips removed, we again confirmed hemostasis with the loop with coagulating current.  Upon completion of the entire procedure, the bladder and posterior urethra were reexamined, confirming open prostatic urethra and bladder neck without evidence of bleeding or perforation.  Both ureteral orifices and the external sphincter were noted to be intact.  The resectoscope was withdrawn under direct vision and a 22 French three-way Foley catheter with a 30 cc balloon was inserted into the bladder.  The balloon was inflated with 50 cc of sterile water.  After multiple manual irrigations ensuring light pink return of the irrigant, the procedure was terminated.  The catheter was attached to a drainage bag and continuous bladder irrigation was started with normal saline.  The patient was positioned supine.  At the end of the procedure, all counts were correct.  Patient tolerated the procedure well and was taken to the recovery room satisfactory condition.  Plan: Continuous bladder irrigation overnight. Plan to discharge home tomorrow with Foley catheter in place and void trial in the office in 3 days.  [facility]  Pager: [phone]

## 2021-12-27 NOTE — Anesthesia Procedure Notes (Signed)
Procedure Name: Intubation Date/Time: 12/27/2021 12:16 PM  Performed by: West Pugh, CRNAPre-anesthesia Checklist: Patient identified, Emergency Drugs available, Suction available, Patient being monitored and Timeout performed Patient Re-evaluated:Patient Re-evaluated prior to induction Oxygen Delivery Method: Circle system utilized Preoxygenation: Pre-oxygenation with 100% oxygen Induction Type: IV induction Ventilation: Mask ventilation without difficulty and Oral airway inserted - appropriate to patient size Laryngoscope Size: Mac and 4 Grade View: Grade I Tube type: Oral Tube size: 7.5 mm Number of attempts: 1 Airway Equipment and Method: Stylet Placement Confirmation: ETT inserted through vocal cords under direct vision, positive ETCO2, CO2 detector and breath sounds checked- equal and bilateral Secured at: 23 cm Tube secured with: Tape Dental Injury: Teeth and Oropharynx as per pre-operative assessment

## 2021-12-27 NOTE — Progress Notes (Signed)
Pharmacy Antibiotic Note  Neil Whitaker is a 76 y.o. male admitted on 12/27/2021 for cystoscopy and TURP for bladder outlet obstruction and BPH.  Pharmacy has been consulted for gentamicin dosing for 5 days for urine coverage.   Preop Gent given x1 on 7/17 at 12:17 SCr 2.11 with CrCl ~ 32 ml/min  Plan: Gentamicin 5 mg/kg ('390mg'$ ) IV q24h x5 days Follow up renal function, culture results, and clinical course.   Height: 6' (182.9 cm) Weight: 77.6 kg (171 lb 1.2 oz) IBW/kg (Calculated) : 77.6  Temp (24hrs), Avg:97.6 F (36.4 C), Min:97.6 F (36.4 C), Max:97.6 F (36.4 C)  Recent Labs  Lab 12/21/21 0954  WBC 8.7  CREATININE 1.87*    Estimated Creatinine Clearance: 36.3 mL/min (A) (by C-G formula based on SCr of 1.87 mg/dL (H)).    Allergies  Allergen Reactions   Penicillins Rash    Rash on stomach 50 years ago    Antimicrobials this admission: 7/17 Gentamicin >> (7/21)  Dose adjustments this admission:   Microbiology results: 7/11 Surgical PCR screen: MRSA positive, Staph aureus positive  Thank you for allowing pharmacy to be a part of this patient's care.  Gretta Arab PharmD, BCPS Clinical Pharmacist [facility] main pharmacy [phone] 12/27/2021 11:12 AM

## 2021-12-27 NOTE — Anesthesia Postprocedure Evaluation (Signed)
Anesthesia Post Note  Patient: Neil Whitaker  Procedure(s) Performed: Holmium Laser Enucleation of the Prostate converted to TURP     Patient location during evaluation: PACU Anesthesia Type: General Level of consciousness: sedated Pain management: pain level controlled Vital Signs Assessment: post-procedure vital signs reviewed and stable Respiratory status: spontaneous breathing and respiratory function stable Cardiovascular status: stable Postop Assessment: no apparent nausea or vomiting Anesthetic complications: no   No notable events documented.  Last Vitals:  Vitals:   12/27/21 1430 12/27/21 1445  BP: 114/64 120/66  Pulse: 62 63  Resp: 11 15  Temp:    SpO2: 98% 97%    Last Pain:  Vitals:   12/27/21 1500  TempSrc:   PainSc: 0-No pain                 Giovonni Poirier,Fox DANIEL

## 2021-12-27 NOTE — Transfer of Care (Signed)
Immediate Anesthesia Transfer of Care Note  Patient: Neil Whitaker  Procedure(s) Performed: Holmium Laser Enucleation of the Prostate converted to TURP  Patient Location: PACU  Anesthesia Type:General  Level of Consciousness: drowsy and patient cooperative  Airway & Oxygen Therapy: Patient Spontanous Breathing and Patient connected to face mask oxygen  Post-op Assessment: Report given to RN and Post -op Vital signs reviewed and stable  Post vital signs: Reviewed and stable  Last Vitals:  Vitals Value Taken Time  BP 118/58 12/27/21 1415  Temp    Pulse 66 12/27/21 1417  Resp 15 12/27/21 1417  SpO2 100 % 12/27/21 1417  Vitals shown include unvalidated device data.  Last Pain:  Vitals:   12/27/21 1041  TempSrc:   PainSc: 0-No pain         Complications: No notable events documented.

## 2021-12-28 DIAGNOSIS — N189 Chronic kidney disease, unspecified: Secondary | ICD-10-CM | POA: Diagnosis not present

## 2021-12-28 DIAGNOSIS — Z87440 Personal history of urinary (tract) infections: Secondary | ICD-10-CM | POA: Diagnosis not present

## 2021-12-28 DIAGNOSIS — N138 Other obstructive and reflux uropathy: Secondary | ICD-10-CM | POA: Diagnosis not present

## 2021-12-28 DIAGNOSIS — N401 Enlarged prostate with lower urinary tract symptoms: Secondary | ICD-10-CM | POA: Diagnosis not present

## 2021-12-28 DIAGNOSIS — N281 Cyst of kidney, acquired: Secondary | ICD-10-CM | POA: Diagnosis not present

## 2021-12-28 DIAGNOSIS — I129 Hypertensive chronic kidney disease with stage 1 through stage 4 chronic kidney disease, or unspecified chronic kidney disease: Secondary | ICD-10-CM | POA: Diagnosis not present

## 2021-12-28 LAB — CBC
HCT: 31.7 % — ABNORMAL LOW (ref 39.0–52.0)
Hemoglobin: 10.6 g/dL — ABNORMAL LOW (ref 13.0–17.0)
MCH: 31.2 pg (ref 26.0–34.0)
MCHC: 33.4 g/dL (ref 30.0–36.0)
MCV: 93.2 fL (ref 80.0–100.0)
Platelets: 176 10*3/uL (ref 150–400)
RBC: 3.4 MIL/uL — ABNORMAL LOW (ref 4.22–5.81)
RDW: 15 % (ref 11.5–15.5)
WBC: 13.4 10*3/uL — ABNORMAL HIGH (ref 4.0–10.5)
nRBC: 0 % (ref 0.0–0.2)

## 2021-12-28 LAB — BASIC METABOLIC PANEL
Anion gap: 8 (ref 5–15)
BUN: 47 mg/dL — ABNORMAL HIGH (ref 8–23)
CO2: 18 mmol/L — ABNORMAL LOW (ref 22–32)
Calcium: 9.1 mg/dL (ref 8.9–10.3)
Chloride: 110 mmol/L (ref 98–111)
Creatinine, Ser: 2.17 mg/dL — ABNORMAL HIGH (ref 0.61–1.24)
GFR, Estimated: 31 mL/min — ABNORMAL LOW (ref >60)
Glucose, Bld: 211 mg/dL — ABNORMAL HIGH (ref 70–99)
Potassium: 5.6 mmol/L — ABNORMAL HIGH (ref 3.5–5.1)
Sodium: 136 mmol/L (ref 135–145)

## 2021-12-28 NOTE — Progress Notes (Signed)
  Transition of Care [facility]) Screening Note   Patient Details  Name: Neil Whitaker Date of Birth: 1946-04-29   [facility] of Care [facility]) CM/SW Contact:    Dessa Phi, RN Phone Number: 12/28/2021, 9:54 AM    Transition of Care Department [facility]) has reviewed patient and no TOC needs have been identified at this time. We will continue to monitor patient advancement through interdisciplinary progression rounds. If new patient transition needs arise, please place a TOC consult.

## 2021-12-28 NOTE — Plan of Care (Signed)
  Problem: Activity: Goal: Risk for activity intolerance will decrease Outcome: Adequate for Discharge   Problem: Nutrition: Goal: Adequate nutrition will be maintained Outcome: Adequate for Discharge   Problem: Elimination: Goal: Will not experience complications related to bowel motility Outcome: Adequate for Discharge   Problem: Skin Integrity: Goal: Risk for impaired skin integrity will decrease Outcome: Adequate for Discharge

## 2021-12-28 NOTE — Discharge Summary (Signed)
Date of admission: 12/27/2021  Date of discharge: 12/28/2021  Admission diagnosis: BPH  Discharge diagnosis: BPH  Secondary diagnoses: None  History and Physical: For full details, please see admission history and physical. Briefly, Neil Whitaker is a 76 y.o. year old patient with BPH who underwent TURP.   Hospital Course: The patient recovered in the usual expected fashion.  He had his diet advanced slowly.  Initially managed with IV pain control, then transitioned to PO meds when he was tolerating oral intake.  His labs were stable throughout the hospital course.  He was discharged to home on POD#1.  At the time of discharge the patient was tolerating a regular diet, passing flatus, ambulating, had adequate pain control and was agreeable to discharge.  Follow up as scheduled.    Laboratory values:  Recent Labs    12/27/21 1055 12/28/21 0528  HGB 12.4* 10.6*  HCT 36.9* 31.7*   Recent Labs    12/27/21 1055 12/28/21 0528  CREATININE 2.11* 2.17*    Disposition: Home  Discharge instruction: The patient was instructed to be ambulatory but told to refrain from heavy lifting, strenuous activity, or driving.  Discharge medications:  Allergies as of 12/28/2021       Reactions   Penicillins Rash   Rash on stomach 50 years ago        Medication List     STOP taking these medications    Fish Oil 1000 MG Caps       TAKE these medications    allopurinol 100 MG tablet Commonly known as: ZYLOPRIM Take 200 mg by mouth every morning.   amLODipine 10 MG tablet Commonly known as: NORVASC Take 10 mg by mouth daily.   carvedilol 12.5 MG tablet Commonly known as: COREG Take 1 tablet (12.5 mg total) by mouth 2 (two) times daily. What changed:  how much to take when to take this   ciprofloxacin 500 MG tablet Commonly known as: Cipro Take 1 tablet (500 mg total) by mouth 2 (two) times daily for 5 days.   cycloSPORINE 0.05 % ophthalmic emulsion Commonly known as:  RESTASIS Place 1 drop into both eyes See admin instructions. Instill one drop into each eye every morning, may use in the evening as needed for irritation/dry eyes   docusate sodium 100 MG capsule Commonly known as: Colace Take 1 capsule (100 mg total) by mouth daily as needed for up to 30 doses.   finasteride 5 MG tablet Commonly known as: PROSCAR Take 5 mg by mouth every morning.   lisinopril 20 MG tablet Commonly known as: ZESTRIL Take 20 mg by mouth every morning.   methocarbamol 500 MG tablet Commonly known as: Robaxin Take 1 tablet (500 mg total) by mouth 4 (four) times daily.   oxyCODONE-acetaminophen 5-325 MG tablet Commonly known as: Percocet Take 1 tablet by mouth every 4 (four) hours as needed for up to 15 doses for severe pain. What changed: how much to take   Synthroid 50 MCG tablet Generic drug: levothyroxine Take 50 mcg by mouth daily before breakfast.   Systane Balance 0.6 % Soln Generic drug: Propylene Glycol Place 1 drop into both eyes every morning.   tamsulosin 0.4 MG Caps capsule Commonly known as: FLOMAX Take 0.4 mg by mouth every morning.   triamcinolone cream 0.1 % Commonly known as: KENALOG Apply 1 application  topically daily as needed (itching on legs).        Followup:   Follow-up Information  [facility] Follow up on 12/30/2021.   Why: 8:15AM Contact information: [place]. [facility]  Pager: [phone]

## 2021-12-29 LAB — SURGICAL PATHOLOGY

## 2022-02-02 DIAGNOSIS — R350 Frequency of micturition: Secondary | ICD-10-CM | POA: Diagnosis not present

## 2022-02-05 IMAGING — MR MR ABDOMEN WO/W CM
11 of 18 series · 28 of 48 positions shown · IV contrast (multihance)
Comparison: CT on 03/04/2020

CLINICAL DATA: Follow-up cystic pancreatic lesion.

EXAM:
MRI ABDOMEN WITHOUT AND WITH CONTRAST
TECHNIQUE: Multiplanar multisequence MR imaging of the abdomen was performed
both before and after the administration of intravenous contrast.
CONTRAST:  15mL MULTIHANCE GADOBENATE DIMEGLUMINE 529 MG/ML IV SOLN

[Series 3: T2 · axial · 6.0mm · 1.12mm/px · z∈[-167,+70]mm · 2 of 37 slices shown (1 of 2)]
[im 1/37]
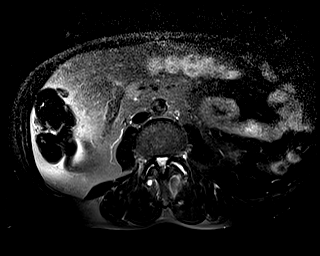
[im 37/37]
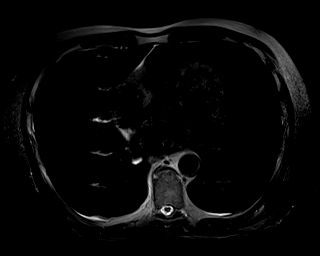

[Series 4: cor haste · coronal · 5.0mm · 0.70mm/px · 2 of 39 slices shown]
[im 1/39]
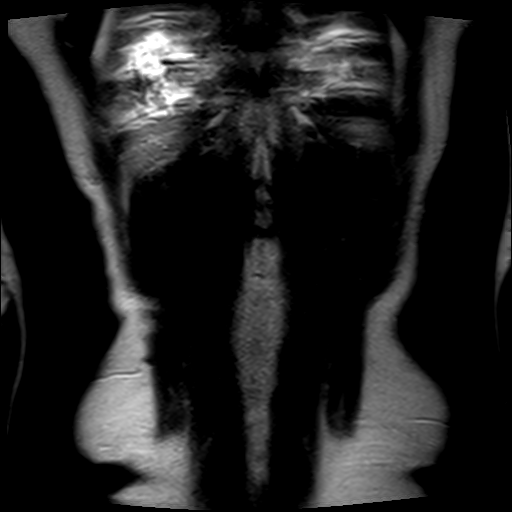
[im 39/39]
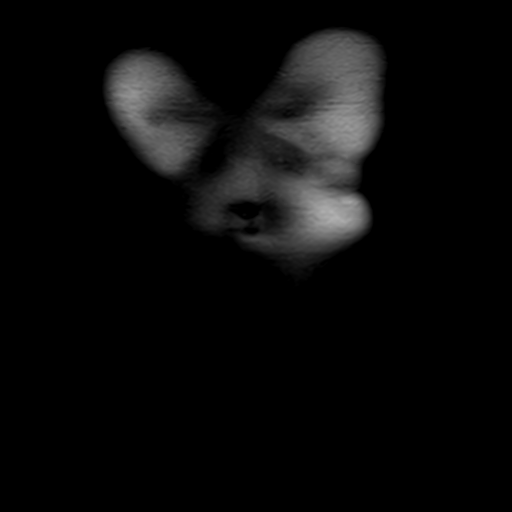

[Series 5: axial haste · axial · 6.0mm · 0.70mm/px · z∈[-186,+45]mm · 2 of 36 slices shown]
[im 1/36]
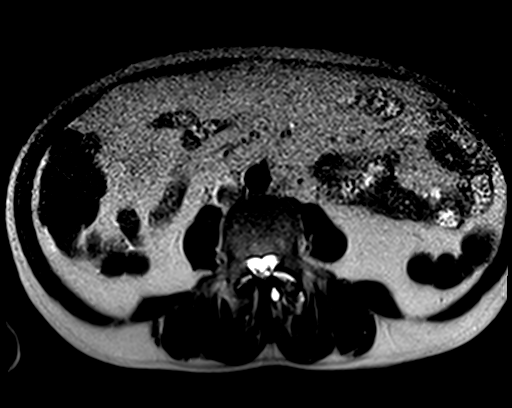
[im 36/36]
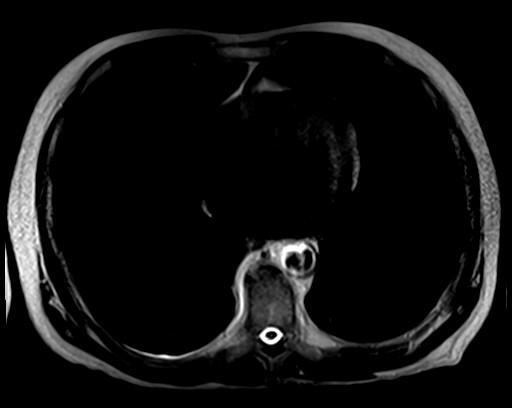

[Series 6: ep2d_diff_b50_500_800_p2_trig · axial · 6.0mm · 1.88mm/px · z∈[-164,+67]mm · 5 of 108 slices shown]
[im 1/108]
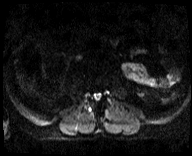
[im 27/108]
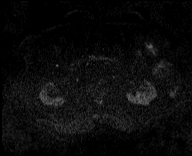
[im 54/108]
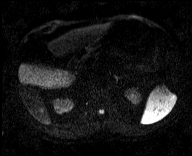
[im 81/108]
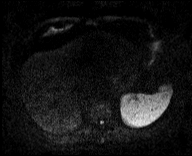
[im 108/108]
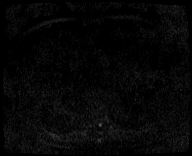

[Series 7: ep2d_diff_b50_500_800_p2_trig_adc · axial · 6.0mm · 1.88mm/px · 1 of 36 slices shown]
[im 1/36]
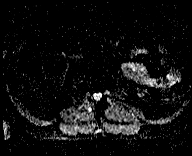

[Series 8: T1 · axial · 6.0mm · 0.70mm/px · z∈[-181,+43]mm · 2 of 70 slices shown]
[im 1/70]
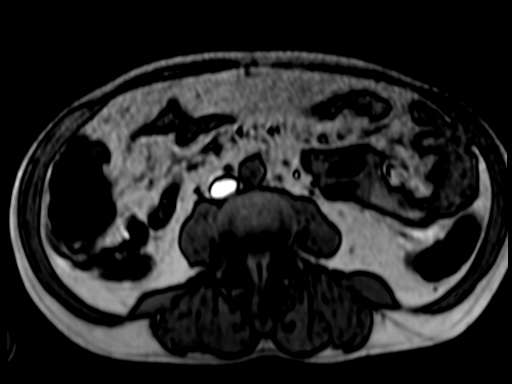
[im 70/70]
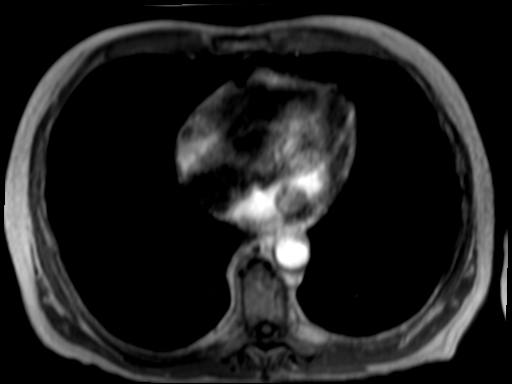

[Series 9: T2 · coronal · 3.0mm · 0.70mm/px · 2 of 65 slices shown (2 of 2)]
[im 1/65]
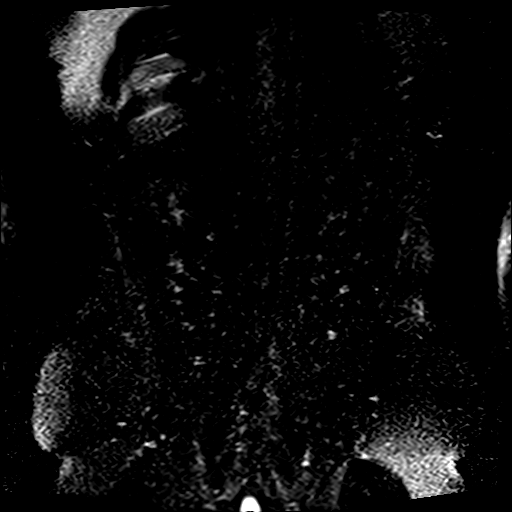
[im 65/65]
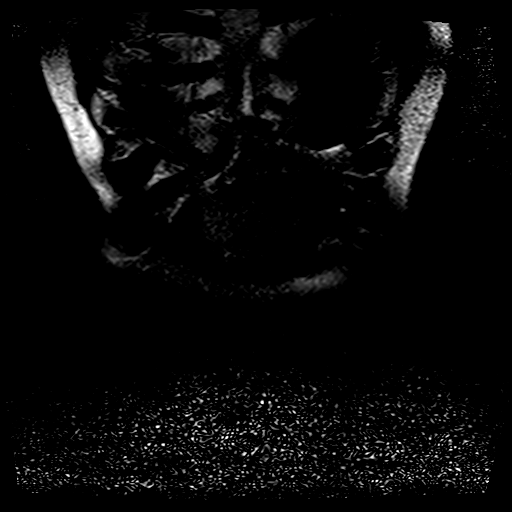

[Series 14: T1 dynamic · axial · non-contrast · 2.5mm · 0.70mm/px · z∈[-188,+50]mm · 3 of 96 slices shown]
[im 1/96]
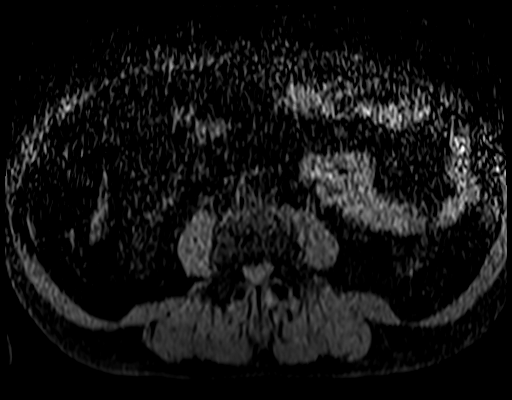
[im 48/96]
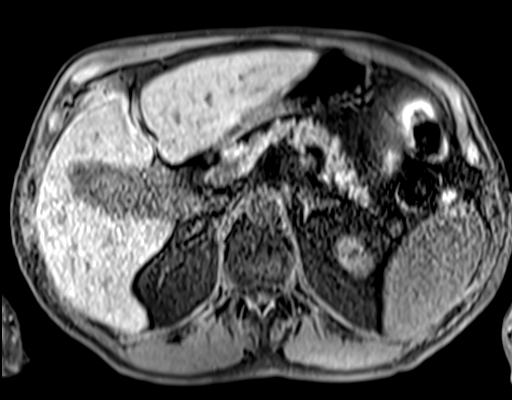
[im 96/96]
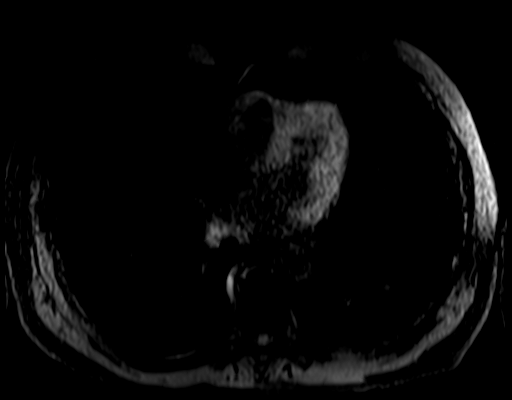

[Series 15: T1 dynamic post-contrast · axial · 2.5mm · 0.70mm/px · z∈[-188,+50]mm · 3 of 96 slices shown (1 of 3)]
[im 1/96]
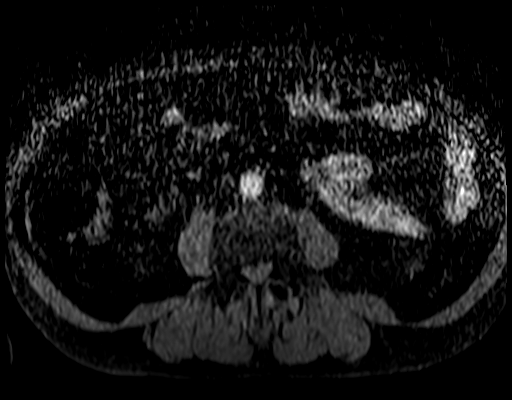
[im 48/96]
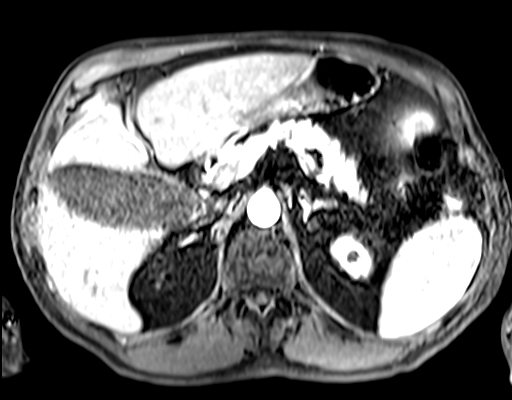
[im 96/96]
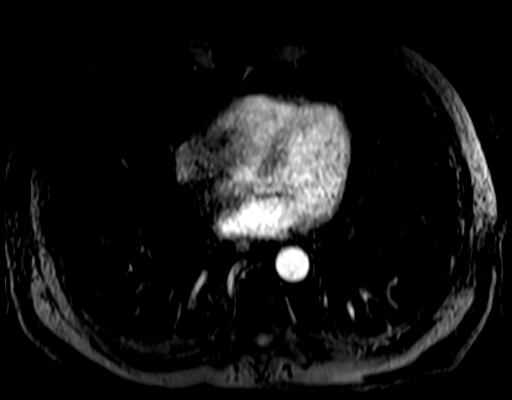

[Series 16: T1 dynamic post-contrast · axial · 2.5mm · 0.70mm/px · z∈[-188,+50]mm · 3 of 96 slices shown (2 of 3)]
[im 1/96]
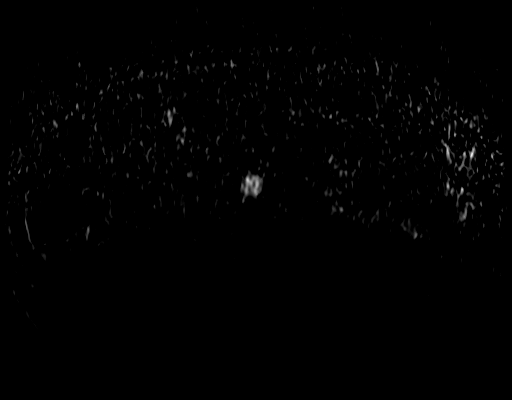
[im 48/96]
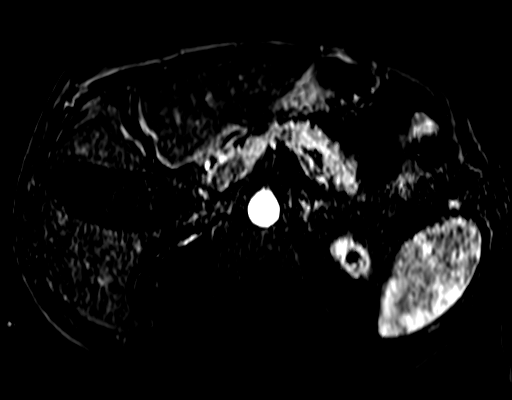
[im 96/96]
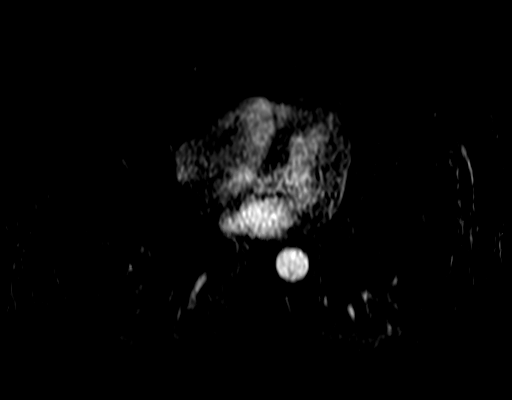

[Series 17: T1 dynamic post-contrast · axial · 2.5mm · 0.70mm/px · z∈[-188,+50]mm · 3 of 96 slices shown (3 of 3)]
[im 1/96]
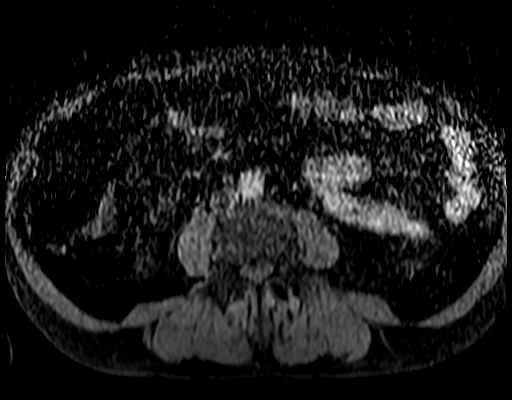
[im 48/96]
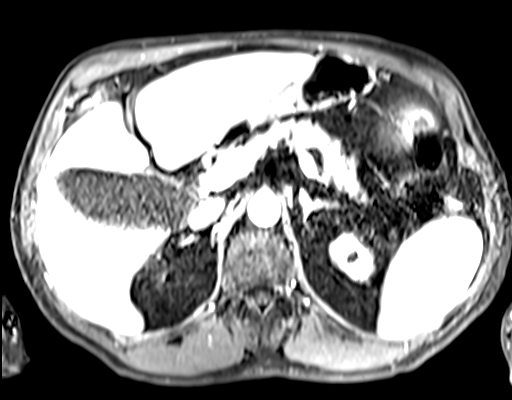
[im 96/96]
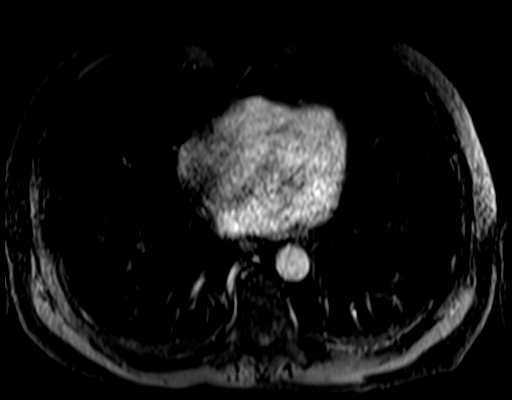

[28 of 48 positions shown; findings below may reference images not displayed]

FINDINGS: Lower chest: No acute findings.

Hepatobiliary: No hepatic masses identified. Multiple small hepatic
cysts remains stable. Left hepatic lobe hypertrophy is again seen
which is nonspecific, and cirrhosis cannot be excluded. A few tiny
sub-cm gallstones are noted, however there is no evidence of
cholecystitis or biliary ductal dilatation.

Pancreas: 1.5 cm simple appearing cyst in the pancreatic body
remains stable. A 6 mm cyst is seen in the pancreatic tail on image
[DATE]. No other pancreatic lesions identified. No evidence of
pancreatic ductal dilatation or pancreas divisum.

Spleen:  Within normal limits in size and appearance.

Adrenals/Urinary Tract: Normal adrenal glands. Both kidneys are
normal in size. Numerous small renal cysts are again seen
bilaterally. No masses identified. No evidence of hydronephrosis.

Stomach/Bowel: Visualized portion unremarkable.

Vascular/Lymphatic: No pathologically enlarged lymph nodes
identified. No abdominal aortic aneurysm.

Other:  None.

Musculoskeletal:  No suspicious bone lesions identified.
IMPRESSION: Stable small cystic lesions in the pancreatic body and tail, largest
measuring 1.5 cm. Recommend continued follow-up by MRI in 6 months.
This recommendation follows ACR consensus guidelines: Management of
Incidental Pancreatic Cysts: A White Paper of the ACR Incidental
Findings Committee. [HOSPITAL] 1143;[DATE].

Cholelithiasis.  No radiographic evidence of cholecystitis.

## 2022-03-01 DIAGNOSIS — Z23 Encounter for immunization: Secondary | ICD-10-CM | POA: Diagnosis not present

## 2022-03-29 DIAGNOSIS — R350 Frequency of micturition: Secondary | ICD-10-CM | POA: Diagnosis not present

## 2022-03-29 DIAGNOSIS — N401 Enlarged prostate with lower urinary tract symptoms: Secondary | ICD-10-CM | POA: Diagnosis not present

## 2022-04-27 DIAGNOSIS — M1991 Primary osteoarthritis, unspecified site: Secondary | ICD-10-CM | POA: Diagnosis not present

## 2022-04-27 DIAGNOSIS — G629 Polyneuropathy, unspecified: Secondary | ICD-10-CM | POA: Diagnosis not present

## 2022-04-27 DIAGNOSIS — Z79899 Other long term (current) drug therapy: Secondary | ICD-10-CM | POA: Diagnosis not present

## 2022-04-27 DIAGNOSIS — M1009 Idiopathic gout, multiple sites: Secondary | ICD-10-CM | POA: Diagnosis not present

## 2022-04-27 DIAGNOSIS — Z6823 Body mass index (BMI) 23.0-23.9, adult: Secondary | ICD-10-CM | POA: Diagnosis not present

## 2022-04-27 DIAGNOSIS — R768 Other specified abnormal immunological findings in serum: Secondary | ICD-10-CM | POA: Diagnosis not present

## 2022-05-16 DIAGNOSIS — Z23 Encounter for immunization: Secondary | ICD-10-CM | POA: Diagnosis not present

## 2022-06-28 DIAGNOSIS — Z8739 Personal history of other diseases of the musculoskeletal system and connective tissue: Secondary | ICD-10-CM | POA: Diagnosis not present

## 2022-06-28 DIAGNOSIS — M109 Gout, unspecified: Secondary | ICD-10-CM | POA: Diagnosis not present

## 2022-06-28 DIAGNOSIS — N400 Enlarged prostate without lower urinary tract symptoms: Secondary | ICD-10-CM | POA: Diagnosis not present

## 2022-06-28 DIAGNOSIS — N1831 Chronic kidney disease, stage 3a: Secondary | ICD-10-CM | POA: Diagnosis not present

## 2022-06-28 DIAGNOSIS — E782 Mixed hyperlipidemia: Secondary | ICD-10-CM | POA: Diagnosis not present

## 2022-06-28 DIAGNOSIS — D649 Anemia, unspecified: Secondary | ICD-10-CM | POA: Diagnosis not present

## 2022-06-28 DIAGNOSIS — E875 Hyperkalemia: Secondary | ICD-10-CM | POA: Diagnosis not present

## 2022-06-28 DIAGNOSIS — I10 Essential (primary) hypertension: Secondary | ICD-10-CM | POA: Diagnosis not present

## 2022-06-28 DIAGNOSIS — K760 Fatty (change of) liver, not elsewhere classified: Secondary | ICD-10-CM | POA: Diagnosis not present

## 2022-06-28 DIAGNOSIS — E039 Hypothyroidism, unspecified: Secondary | ICD-10-CM | POA: Diagnosis not present

## 2022-06-28 DIAGNOSIS — I700 Atherosclerosis of aorta: Secondary | ICD-10-CM | POA: Diagnosis not present

## 2022-06-28 DIAGNOSIS — K869 Disease of pancreas, unspecified: Secondary | ICD-10-CM | POA: Diagnosis not present

## 2022-07-01 DIAGNOSIS — E875 Hyperkalemia: Secondary | ICD-10-CM | POA: Diagnosis not present

## 2022-07-29 DIAGNOSIS — L308 Other specified dermatitis: Secondary | ICD-10-CM | POA: Diagnosis not present

## 2022-09-26 DIAGNOSIS — N200 Calculus of kidney: Secondary | ICD-10-CM | POA: Diagnosis not present

## 2022-09-26 DIAGNOSIS — Z125 Encounter for screening for malignant neoplasm of prostate: Secondary | ICD-10-CM | POA: Diagnosis not present

## 2022-09-26 DIAGNOSIS — R350 Frequency of micturition: Secondary | ICD-10-CM | POA: Diagnosis not present

## 2022-09-26 DIAGNOSIS — N401 Enlarged prostate with lower urinary tract symptoms: Secondary | ICD-10-CM | POA: Diagnosis not present

## 2022-09-26 DIAGNOSIS — N281 Cyst of kidney, acquired: Secondary | ICD-10-CM | POA: Diagnosis not present

## 2022-10-26 DIAGNOSIS — M1009 Idiopathic gout, multiple sites: Secondary | ICD-10-CM | POA: Diagnosis not present

## 2022-10-26 DIAGNOSIS — M5136 Other intervertebral disc degeneration, lumbar region: Secondary | ICD-10-CM | POA: Diagnosis not present

## 2022-10-26 DIAGNOSIS — G629 Polyneuropathy, unspecified: Secondary | ICD-10-CM | POA: Diagnosis not present

## 2022-10-26 DIAGNOSIS — R768 Other specified abnormal immunological findings in serum: Secondary | ICD-10-CM | POA: Diagnosis not present

## 2022-10-26 DIAGNOSIS — M1991 Primary osteoarthritis, unspecified site: Secondary | ICD-10-CM | POA: Diagnosis not present

## 2022-10-26 DIAGNOSIS — Z6823 Body mass index (BMI) 23.0-23.9, adult: Secondary | ICD-10-CM | POA: Diagnosis not present

## 2022-12-19 DIAGNOSIS — E039 Hypothyroidism, unspecified: Secondary | ICD-10-CM | POA: Diagnosis not present

## 2022-12-19 DIAGNOSIS — Z1331 Encounter for screening for depression: Secondary | ICD-10-CM | POA: Diagnosis not present

## 2022-12-19 DIAGNOSIS — Z23 Encounter for immunization: Secondary | ICD-10-CM | POA: Diagnosis not present

## 2022-12-19 DIAGNOSIS — N400 Enlarged prostate without lower urinary tract symptoms: Secondary | ICD-10-CM | POA: Diagnosis not present

## 2022-12-19 DIAGNOSIS — K869 Disease of pancreas, unspecified: Secondary | ICD-10-CM | POA: Diagnosis not present

## 2022-12-19 DIAGNOSIS — Z0000 Encounter for general adult medical examination without abnormal findings: Secondary | ICD-10-CM | POA: Diagnosis not present

## 2022-12-19 DIAGNOSIS — E782 Mixed hyperlipidemia: Secondary | ICD-10-CM | POA: Diagnosis not present

## 2022-12-19 DIAGNOSIS — D649 Anemia, unspecified: Secondary | ICD-10-CM | POA: Diagnosis not present

## 2022-12-19 DIAGNOSIS — I10 Essential (primary) hypertension: Secondary | ICD-10-CM | POA: Diagnosis not present

## 2022-12-19 DIAGNOSIS — K760 Fatty (change of) liver, not elsewhere classified: Secondary | ICD-10-CM | POA: Diagnosis not present

## 2022-12-19 DIAGNOSIS — N1831 Chronic kidney disease, stage 3a: Secondary | ICD-10-CM | POA: Diagnosis not present

## 2022-12-19 DIAGNOSIS — M109 Gout, unspecified: Secondary | ICD-10-CM | POA: Diagnosis not present

## 2023-02-02 DIAGNOSIS — E039 Hypothyroidism, unspecified: Secondary | ICD-10-CM | POA: Diagnosis not present

## 2023-02-25 DIAGNOSIS — Z23 Encounter for immunization: Secondary | ICD-10-CM | POA: Diagnosis not present

## 2023-04-27 DIAGNOSIS — Z6823 Body mass index (BMI) 23.0-23.9, adult: Secondary | ICD-10-CM | POA: Diagnosis not present

## 2023-04-27 DIAGNOSIS — Z79899 Other long term (current) drug therapy: Secondary | ICD-10-CM | POA: Diagnosis not present

## 2023-04-27 DIAGNOSIS — M1991 Primary osteoarthritis, unspecified site: Secondary | ICD-10-CM | POA: Diagnosis not present

## 2023-04-27 DIAGNOSIS — M1009 Idiopathic gout, multiple sites: Secondary | ICD-10-CM | POA: Diagnosis not present

## 2023-04-27 DIAGNOSIS — R768 Other specified abnormal immunological findings in serum: Secondary | ICD-10-CM | POA: Diagnosis not present

## 2023-06-22 DIAGNOSIS — M109 Gout, unspecified: Secondary | ICD-10-CM | POA: Diagnosis not present

## 2023-06-22 DIAGNOSIS — K760 Fatty (change of) liver, not elsewhere classified: Secondary | ICD-10-CM | POA: Diagnosis not present

## 2023-06-22 DIAGNOSIS — I10 Essential (primary) hypertension: Secondary | ICD-10-CM | POA: Diagnosis not present

## 2023-06-22 DIAGNOSIS — E782 Mixed hyperlipidemia: Secondary | ICD-10-CM | POA: Diagnosis not present

## 2023-06-22 DIAGNOSIS — N1831 Chronic kidney disease, stage 3a: Secondary | ICD-10-CM | POA: Diagnosis not present

## 2023-06-22 DIAGNOSIS — K869 Disease of pancreas, unspecified: Secondary | ICD-10-CM | POA: Diagnosis not present

## 2023-06-22 DIAGNOSIS — I700 Atherosclerosis of aorta: Secondary | ICD-10-CM | POA: Diagnosis not present

## 2023-06-22 DIAGNOSIS — E039 Hypothyroidism, unspecified: Secondary | ICD-10-CM | POA: Diagnosis not present

## 2023-06-22 DIAGNOSIS — N400 Enlarged prostate without lower urinary tract symptoms: Secondary | ICD-10-CM | POA: Diagnosis not present

## 2023-06-22 DIAGNOSIS — Z8739 Personal history of other diseases of the musculoskeletal system and connective tissue: Secondary | ICD-10-CM | POA: Diagnosis not present

## 2023-06-22 DIAGNOSIS — D649 Anemia, unspecified: Secondary | ICD-10-CM | POA: Diagnosis not present

## 2023-10-31 DIAGNOSIS — R768 Other specified abnormal immunological findings in serum: Secondary | ICD-10-CM | POA: Diagnosis not present

## 2023-10-31 DIAGNOSIS — Z6822 Body mass index (BMI) 22.0-22.9, adult: Secondary | ICD-10-CM | POA: Diagnosis not present

## 2023-10-31 DIAGNOSIS — M1991 Primary osteoarthritis, unspecified site: Secondary | ICD-10-CM | POA: Diagnosis not present

## 2023-10-31 DIAGNOSIS — M1009 Idiopathic gout, multiple sites: Secondary | ICD-10-CM | POA: Diagnosis not present

## 2023-10-31 DIAGNOSIS — Z79899 Other long term (current) drug therapy: Secondary | ICD-10-CM | POA: Diagnosis not present

## 2023-11-07 DIAGNOSIS — Z125 Encounter for screening for malignant neoplasm of prostate: Secondary | ICD-10-CM | POA: Diagnosis not present

## 2023-11-13 DIAGNOSIS — R350 Frequency of micturition: Secondary | ICD-10-CM | POA: Diagnosis not present

## 2023-11-13 DIAGNOSIS — N401 Enlarged prostate with lower urinary tract symptoms: Secondary | ICD-10-CM | POA: Diagnosis not present

## 2023-11-23 DIAGNOSIS — L308 Other specified dermatitis: Secondary | ICD-10-CM | POA: Diagnosis not present

## 2024-01-18 DIAGNOSIS — H60391 Other infective otitis externa, right ear: Secondary | ICD-10-CM | POA: Diagnosis not present

## 2024-01-19 ENCOUNTER — Ambulatory Visit (INDEPENDENT_AMBULATORY_CARE_PROVIDER_SITE_OTHER): Admitting: Physician Assistant

## 2024-01-19 ENCOUNTER — Encounter (INDEPENDENT_AMBULATORY_CARE_PROVIDER_SITE_OTHER): Payer: Self-pay | Admitting: Physician Assistant

## 2024-01-19 VITALS — BP 103/65 | HR 68

## 2024-01-19 DIAGNOSIS — H60501 Unspecified acute noninfective otitis externa, right ear: Secondary | ICD-10-CM

## 2024-01-19 DIAGNOSIS — N1831 Chronic kidney disease, stage 3a: Secondary | ICD-10-CM | POA: Diagnosis not present

## 2024-01-19 DIAGNOSIS — M109 Gout, unspecified: Secondary | ICD-10-CM | POA: Diagnosis not present

## 2024-01-19 DIAGNOSIS — T161XXA Foreign body in right ear, initial encounter: Secondary | ICD-10-CM | POA: Diagnosis not present

## 2024-01-19 DIAGNOSIS — K760 Fatty (change of) liver, not elsewhere classified: Secondary | ICD-10-CM | POA: Diagnosis not present

## 2024-01-19 DIAGNOSIS — E782 Mixed hyperlipidemia: Secondary | ICD-10-CM | POA: Diagnosis not present

## 2024-01-19 DIAGNOSIS — N400 Enlarged prostate without lower urinary tract symptoms: Secondary | ICD-10-CM | POA: Diagnosis not present

## 2024-01-19 DIAGNOSIS — Z0000 Encounter for general adult medical examination without abnormal findings: Secondary | ICD-10-CM | POA: Diagnosis not present

## 2024-01-19 DIAGNOSIS — E039 Hypothyroidism, unspecified: Secondary | ICD-10-CM | POA: Diagnosis not present

## 2024-01-19 DIAGNOSIS — K869 Disease of pancreas, unspecified: Secondary | ICD-10-CM | POA: Diagnosis not present

## 2024-01-19 DIAGNOSIS — T161XXD Foreign body in right ear, subsequent encounter: Secondary | ICD-10-CM

## 2024-01-19 DIAGNOSIS — Z1331 Encounter for screening for depression: Secondary | ICD-10-CM | POA: Diagnosis not present

## 2024-01-19 DIAGNOSIS — D649 Anemia, unspecified: Secondary | ICD-10-CM | POA: Diagnosis not present

## 2024-01-19 DIAGNOSIS — I10 Essential (primary) hypertension: Secondary | ICD-10-CM | POA: Diagnosis not present

## 2024-01-19 NOTE — Progress Notes (Signed)
 Dear Dr. Seabron, Here is my assessment for our mutual patient, Neil Earl Jr. Thank you for allowing me the opportunity to care for your patient. Please do not hesitate to contact me should you have any other questions. Sincerely, Chyrl Cohen PA-C  Otolaryngology Clinic Note Referring provider: Dr. Seabron HPI:  Neil Whitaker is a 78 y.o. male kindly referred by Dr. Seabron   The patient is a 78 year old gentleman seen in our office for evaluation of right ear foreign body.  The patient notes he wears hearing aids, approximately 7 days ago he noticed the tip of his right hearing aid was stuck in his right ear canal.  He had some irritation to the ear canal.  He followed up with his primary care provider and was seen yesterday, they attempted removing it this was unsuccessful and uncomfortable.  He was placed on Ciprodex drops and referred to our office.  He denies any significant changes to his baseline hearing.   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   Elevated blood uric acid level    Gout of big toe    H/O prostatitis    Hearing loss    BILATERAL HEARING AID   History of elevated PSA    History of kidney stones    Hypercholesterolemia    Hypertension    Low back pain    Pre-diabetes    Subclinical hypothyroidism    Synovitis of wrist      Past Surgical History:  Procedure Laterality Date   APPENDECTOMY     BACK SURGERY     colonoscopoy      EYE SURGERY Bilateral    cataract   HEMORRHOIDECTOMY WITH HEMORRHOID BANDING     right carpal tunnel release       Family History  Problem Relation Age of Onset   Cancer Mother        KIDNEY   Gout Father    Lupus Sister    CVA Paternal  Grandmother    Heart attack Paternal Grandfather      Social Connections: Not on file      Current Outpatient Medications:    allopurinol  (ZYLOPRIM ) 100 MG tablet, Take 200 mg by mouth every morning., Disp: , Rfl:    amLODipine  (NORVASC ) 10 MG tablet, Take 10 mg by mouth daily., Disp: , Rfl:    carvedilol  (COREG ) 12.5 MG tablet, Take 1 tablet (12.5 mg total) by mouth 2 (two) times daily. (Patient taking differently: Take 25 mg by mouth every morning.), Disp: 180 tablet, Rfl: 3   cycloSPORINE  (RESTASIS ) 0.05 % ophthalmic emulsion, Place 1 drop into both eyes See admin instructions. Instill one drop into each eye every morning, may use in the evening as needed for irritation/dry eyes, Disp: , Rfl:    docusate sodium  (COLACE) 100 MG capsule, Take 1 capsule (100 mg total) by mouth daily as needed for up to 30 doses., Disp: 30 capsule, Rfl: 0   finasteride  (PROSCAR ) 5 MG tablet, Take 5 mg by mouth every morning., Disp: , Rfl:    lisinopril  (PRINIVIL ,ZESTRIL ) 20 MG tablet, Take 20 mg by mouth every morning., Disp: , Rfl:    methocarbamol  (ROBAXIN ) 500 MG tablet, Take 1  tablet (500 mg total) by mouth 4 (four) times daily. (Patient not taking: Reported on 12/27/2021), Disp: 90 tablet, Rfl: 2   oxyCODONE -acetaminophen  (PERCOCET) 5-325 MG tablet, Take 1 tablet by mouth every 4 (four) hours as needed for up to 15 doses for severe pain., Disp: 15 tablet, Rfl: 0   Propylene Glycol (SYSTANE BALANCE) 0.6 % SOLN, Place 1 drop into both eyes every morning., Disp: , Rfl:    SYNTHROID  50 MCG tablet, Take 50 mcg by mouth daily before breakfast., Disp: , Rfl:    tamsulosin  (FLOMAX ) 0.4 MG CAPS capsule, Take 0.4 mg by mouth every morning., Disp: , Rfl:    triamcinolone cream (KENALOG) 0.1 %, Apply 1 application  topically daily as needed (itching on legs)., Disp: , Rfl:    Physical Exam:   BP 103/65   Pulse 68   SpO2 96%   Pertinent Findings  CN II-XII intact Right external auditory canal with slight  erythema, no significant discharge, clear rubber hearing aid tip noted, left EAC clear, TM intact with well-pneumatized middle ear space No lesions of oral cavity/oropharynx; dentition within normal limits No obviously palpable neck masses/lymphadenopathy/thyromegaly No respiratory distress or stridor  Seprately Identifiable Procedures:  Procedure: Bilateral ear microscopy and foreign body removal using microscope (CPT 69200) - Mod 25 Pre-procedure diagnosis: right Ear Foreign body Post-procedure diagnosis: same Indication: right Ear foreign body; given patient's otologic complaints and physical exam, bilateral otologic examination using microscope was performed and ear foreign body removed  Procedure: Patient was placed semi-recumbent. Both ear canals were examined using the microscope with findings above. Hearing aid ear piece  removed on right using forceps  with improvement in EAC examination and patency. Left: EAC was patent. TM was intact . Middle ear was aerated. Drainage:none Right: EAC was patent. TM was intact . Middle ear was aerated . Drainage: minimal  Foreign body was removed. There was no trauma to the [facility] Patient tolerated the procedure well.    Impression & Plans:  Neil Whitaker is a 78 y.o. male with the following   Foreign body in the right ear-  Hearing aid earpiece removed without difficulty.  He did have some minimal swelling and discharge in the right external auditory canal.  He is already been prescribed Ciprodex and recommend he use this for the next 7 days.  If he continues to have any issues or concerns I like to see him back in the office for repeat evaluation.  Otherwise he may follow-up on a PRN.  He verbalized understanding and agreement to today's plan had no further questions or concerns.   - f/u prn   Thank you for allowing me the opportunity to care for your patient. Please do not hesitate to contact me should you have any other  questions.  Sincerely, Chyrl Cohen PA-C Neil Whitaker Phone: [phone] Fax: [phone]  01/19/2024, 11:03 AM

## 2024-01-22 DIAGNOSIS — E875 Hyperkalemia: Secondary | ICD-10-CM | POA: Diagnosis not present

## 2024-01-26 DIAGNOSIS — J029 Acute pharyngitis, unspecified: Secondary | ICD-10-CM | POA: Diagnosis not present

## 2024-02-06 DIAGNOSIS — L309 Dermatitis, unspecified: Secondary | ICD-10-CM | POA: Diagnosis not present

## 2024-02-17 DIAGNOSIS — Z23 Encounter for immunization: Secondary | ICD-10-CM | POA: Diagnosis not present

## 2024-04-18 DIAGNOSIS — Z23 Encounter for immunization: Secondary | ICD-10-CM | POA: Diagnosis not present

## 2024-05-02 DIAGNOSIS — R7681 Abnormal rheumatoid factor and anti-citrullinated protein antibody without rheumatoid arthritis: Secondary | ICD-10-CM | POA: Diagnosis not present

## 2024-05-02 DIAGNOSIS — Z6823 Body mass index (BMI) 23.0-23.9, adult: Secondary | ICD-10-CM | POA: Diagnosis not present

## 2024-05-02 DIAGNOSIS — Z79899 Other long term (current) drug therapy: Secondary | ICD-10-CM | POA: Diagnosis not present

## 2024-05-02 DIAGNOSIS — M1009 Idiopathic gout, multiple sites: Secondary | ICD-10-CM | POA: Diagnosis not present

## 2024-05-02 DIAGNOSIS — M1991 Primary osteoarthritis, unspecified site: Secondary | ICD-10-CM | POA: Diagnosis not present
